# Patient Record
Sex: Female | Born: 1975 | Race: Black or African American | Hispanic: No | Marital: Single | Smoking: Never smoker
Health system: Southern US, Community
[De-identification: ages and names within clinical notes are randomized; demographics above are authoritative.]

## PROBLEM LIST (undated history)

## (undated) DIAGNOSIS — I10 Essential (primary) hypertension: Secondary | ICD-10-CM

## (undated) DIAGNOSIS — R51 Headache: Secondary | ICD-10-CM

## (undated) DIAGNOSIS — R519 Headache, unspecified: Secondary | ICD-10-CM

---

## 1998-07-27 ENCOUNTER — Ambulatory Visit (HOSPITAL_COMMUNITY): Admission: RE | Admit: 1998-07-27 | Discharge: 1998-07-27 | Payer: Self-pay | Admitting: Obstetrics and Gynecology

## 1998-08-19 HISTORY — PX: TUBAL LIGATION: SHX77

## 1998-09-11 ENCOUNTER — Ambulatory Visit (HOSPITAL_COMMUNITY): Admission: RE | Admit: 1998-09-11 | Discharge: 1998-09-11 | Payer: Self-pay | Admitting: Obstetrics

## 1998-12-24 ENCOUNTER — Inpatient Hospital Stay (HOSPITAL_COMMUNITY): Admission: AD | Admit: 1998-12-24 | Discharge: 1998-12-24 | Payer: Self-pay | Admitting: Family Medicine

## 1998-12-25 ENCOUNTER — Inpatient Hospital Stay (HOSPITAL_COMMUNITY): Admission: RE | Admit: 1998-12-25 | Discharge: 1998-12-25 | Payer: Self-pay | Admitting: *Deleted

## 1999-01-03 ENCOUNTER — Inpatient Hospital Stay (HOSPITAL_COMMUNITY): Admission: AD | Admit: 1999-01-03 | Discharge: 1999-01-03 | Payer: Self-pay | Admitting: Obstetrics

## 1999-02-12 ENCOUNTER — Inpatient Hospital Stay (HOSPITAL_COMMUNITY): Admission: AD | Admit: 1999-02-12 | Discharge: 1999-02-12 | Payer: Self-pay | Admitting: Obstetrics

## 1999-02-12 ENCOUNTER — Encounter: Payer: Self-pay | Admitting: Obstetrics

## 1999-02-15 ENCOUNTER — Inpatient Hospital Stay (HOSPITAL_COMMUNITY): Admission: AD | Admit: 1999-02-15 | Discharge: 1999-02-18 | Payer: Self-pay | Admitting: Obstetrics & Gynecology

## 1999-02-15 ENCOUNTER — Encounter: Payer: Self-pay | Admitting: Obstetrics & Gynecology

## 1999-02-17 ENCOUNTER — Encounter (INDEPENDENT_AMBULATORY_CARE_PROVIDER_SITE_OTHER): Payer: Self-pay | Admitting: *Deleted

## 2005-04-26 ENCOUNTER — Other Ambulatory Visit: Admission: RE | Admit: 2005-04-26 | Discharge: 2005-04-26 | Payer: Self-pay | Admitting: Obstetrics and Gynecology

## 2006-05-13 ENCOUNTER — Other Ambulatory Visit: Admission: RE | Admit: 2006-05-13 | Discharge: 2006-05-13 | Payer: Self-pay | Admitting: Obstetrics and Gynecology

## 2009-11-20 ENCOUNTER — Emergency Department (HOSPITAL_COMMUNITY): Admission: EM | Admit: 2009-11-20 | Discharge: 2009-11-20 | Payer: Self-pay | Admitting: Emergency Medicine

## 2010-11-07 LAB — URINALYSIS, ROUTINE W REFLEX MICROSCOPIC
Bilirubin Urine: NEGATIVE
Glucose, UA: NEGATIVE mg/dL
Hgb urine dipstick: NEGATIVE
Ketones, ur: 15 mg/dL — AB
Nitrite: NEGATIVE
Protein, ur: NEGATIVE mg/dL
Specific Gravity, Urine: 1.031 — ABNORMAL HIGH (ref 1.005–1.030)
Urobilinogen, UA: 0.2 mg/dL (ref 0.0–1.0)
pH: 7 (ref 5.0–8.0)

## 2010-11-07 LAB — PREGNANCY, URINE: Preg Test, Ur: NEGATIVE

## 2010-11-07 LAB — URINE MICROSCOPIC-ADD ON

## 2010-11-07 LAB — GC/CHLAMYDIA PROBE AMP, GENITAL
Chlamydia, DNA Probe: NEGATIVE
GC Probe Amp, Genital: NEGATIVE

## 2010-11-07 LAB — WET PREP, GENITAL: Yeast Wet Prep HPF POC: NONE SEEN

## 2011-04-26 ENCOUNTER — Inpatient Hospital Stay (INDEPENDENT_AMBULATORY_CARE_PROVIDER_SITE_OTHER)
Admission: RE | Admit: 2011-04-26 | Discharge: 2011-04-26 | Disposition: A | Discharge status: Home / Self Care | Payer: 59 | Source: Ambulatory Visit | Attending: Family Medicine | Admitting: Family Medicine

## 2011-04-26 DIAGNOSIS — J069 Acute upper respiratory infection, unspecified: Secondary | ICD-10-CM

## 2013-01-04 ENCOUNTER — Ambulatory Visit: Payer: Self-pay | Admitting: Family Medicine

## 2013-01-04 DIAGNOSIS — Z0289 Encounter for other administrative examinations: Secondary | ICD-10-CM

## 2013-02-27 ENCOUNTER — Emergency Department (HOSPITAL_COMMUNITY): Payer: Self-pay

## 2013-02-27 ENCOUNTER — Emergency Department (HOSPITAL_COMMUNITY)
Admission: EM | Admit: 2013-02-27 | Discharge: 2013-02-27 | Disposition: A | Discharge status: Home / Self Care | Payer: Self-pay | Attending: Emergency Medicine | Admitting: Emergency Medicine

## 2013-02-27 ENCOUNTER — Encounter (HOSPITAL_COMMUNITY): Payer: Self-pay | Admitting: *Deleted

## 2013-02-27 DIAGNOSIS — M25519 Pain in unspecified shoulder: Secondary | ICD-10-CM | POA: Insufficient documentation

## 2013-02-27 DIAGNOSIS — M19019 Primary osteoarthritis, unspecified shoulder: Secondary | ICD-10-CM | POA: Insufficient documentation

## 2013-02-27 DIAGNOSIS — Z791 Long term (current) use of non-steroidal anti-inflammatories (NSAID): Secondary | ICD-10-CM | POA: Insufficient documentation

## 2013-02-27 DIAGNOSIS — R209 Unspecified disturbances of skin sensation: Secondary | ICD-10-CM | POA: Insufficient documentation

## 2013-02-27 DIAGNOSIS — M25512 Pain in left shoulder: Secondary | ICD-10-CM

## 2013-02-27 DIAGNOSIS — M2550 Pain in unspecified joint: Secondary | ICD-10-CM | POA: Insufficient documentation

## 2013-02-27 DIAGNOSIS — M1990 Unspecified osteoarthritis, unspecified site: Secondary | ICD-10-CM

## 2013-02-27 MED ORDER — NAPROXEN 500 MG PO TABS: 500.0000 mg | ORAL_TABLET | Freq: Two times a day (BID) | ORAL | Status: DC

## 2013-02-27 MED ORDER — IBUPROFEN 800 MG PO TABS
800.0000 mg | ORAL_TABLET | Freq: Once | ORAL | Status: AC
  Administered 2013-02-27: 800 mg via ORAL
  Filled 2013-02-27: qty 1

## 2013-02-27 MED ORDER — CYCLOBENZAPRINE HCL 10 MG PO TABS: 10.0000 mg | ORAL_TABLET | Freq: Two times a day (BID) | ORAL | Status: DC | PRN

## 2013-02-27 NOTE — ED Provider Notes (Signed)
History    CSN: [id number] Arrival date & time 02/27/13  1046  First MD Initiated Contact with Patient 02/27/13 1104     Chief Complaint  Patient presents with  . Shoulder Pain   (Consider location/radiation/quality/duration/timing/severity/associated sxs/prior Treatment) The history is provided by the patient. No language interpreter was used.  Sandra Robbins is a 37 y/o F presenting to the ED, with daughter, with left shoulder pain that has been ongoing for the past 2 weeks, stated that the pain has gotten progressively worse. Patient reported that the pain is localized to the left shoulder described as a tenderness and aching that is constant with intermittent episodes of numbness and tingling that radiates down the left arm to the tips of the fingers. Patient reported that the pain is worse when she lays on her left side and with lifting. Stated that nothing makes the pain better. Patient reported that she has been using Tylenol extra strength to dull down the discomfort. 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 Denied neck pain, injury, fall, blurred vision, visual distortions.  [facility]  History reviewed. No pertinent past medical history. History reviewed. No pertinent past surgical history. History reviewed. No pertinent family history. History  Substance Use Topics  . Smoking status: Never Smoker   . Smokeless tobacco: Not on file  . Alcohol Use: No   OB History   Grav Para Term Preterm Abortions TAB SAB Ect Mult Living                 Review of Systems  Constitutional: Negative for fever and chills.  HENT: Negative for neck pain and neck stiffness.   Eyes: Negative for visual disturbance.  Respiratory: Negative for chest tightness and shortness of breath.   Cardiovascular: Negative for chest pain.  Musculoskeletal: Positive for arthralgias (left shoulder pain).  Neurological: Positive for  numbness. Negative for dizziness, light-headedness and headaches.  All other systems reviewed and are negative.    Allergies  Review of patient's allergies indicates no known allergies.  Home Medications   Current Outpatient Rx  Name  Route  Sig  Dispense  Refill  . acetaminophen (TYLENOL) 500 MG tablet   Oral   Take 500 mg by mouth every 6 (six) hours as needed for pain.         . cyclobenzaprine (FLEXERIL) 10 MG tablet   Oral   Take 1 tablet (10 mg total) by mouth 2 (two) times daily as needed for muscle spasms.   20 tablet   0   . naproxen (NAPROSYN) 500 MG tablet   Oral   Take 1 tablet (500 mg total) by mouth 2 (two) times daily.   30 tablet   0    BP 137/74  Pulse 70  Temp(Src) 98.6 F (37 C) (Oral)  Resp 20  Wt 200 lb (90.719 kg)  SpO2 99%  LMP 02/12/2013 Physical Exam  Nursing note and vitals reviewed. Constitutional: She is oriented to person, place, and time. She appears well-developed and well-nourished. No distress.  HENT:  Head: Normocephalic and atraumatic.  Eyes: Conjunctivae and EOM are normal. Pupils are equal, round, and reactive to light. Right eye exhibits no discharge. Left eye exhibits no discharge.  Neck: Normal range of motion. Neck supple.  Cardiovascular: Normal rate, regular rhythm and normal heart sounds.  Exam reveals no friction rub.   No murmur heard. Pulses:  Radial pulses are 2+ on the right side, and 2+ on the left side.  Pulmonary/Chest: Effort normal and breath sounds normal. No respiratory distress. She has no wheezes. She has no rales.  Musculoskeletal: Normal range of motion. She exhibits tenderness.       Left shoulder: She exhibits tenderness. She exhibits no swelling, no effusion, no crepitus, no deformity, no laceration, no pain, no spasm and normal pulse.       Arms: Negative swelling, erythema, deformity, warmth to touch of the left shoulder. Negative crepitus with motion.   Full ROM to left shoulder with mild  discomfort noted to adduction.  Negative drop arm test Negative apprehension test Strength 5+/5+ with resistance  Lymphadenopathy:    She has no cervical adenopathy.  Neurological: She is alert and oriented to person, place, and time. No cranial nerve deficit. She exhibits normal muscle tone. Coordination normal.  Cranial nerves III-XII grossly intact  Sensation intact to upper extremities, bilaterally, with differentiation to sharp and dull touch  Skin: Skin is warm and dry. No rash noted. She is not diaphoretic. No erythema.  Psychiatric: She has a normal mood and affect. Her behavior is normal. Thought content normal.    ED Course  Procedures (including critical care time) Labs Reviewed - No data to display Dg Cervical Spine Complete  02/27/2013   *RADIOLOGY REPORT*  Clinical Data: Left-sided neck pain and shoulder pain.  CERVICAL SPINE - COMPLETE 4+ VIEW  Comparison: None.  Findings: Cervical spine is visualized from skull base through the cervicothoracic junction.  The prevertebral soft tissues are within normal limits.  Alignment is anatomic.  The foramina are widely patent bilaterally.  The lung apices are clear.  IMPRESSION: Negative cervical spine radiographs.   Original Report Authenticated By: Marin Roberts, M.D.   Dg Shoulder Left  02/27/2013   *RADIOLOGY REPORT*  Clinical Data: Left-sided shoulder and neck pain.  LEFT SHOULDER - 2+ VIEW  Comparison:  None.  Findings:  There is no evidence of fracture or dislocation.  There is no evidence of arthropathy or other focal bone abnormality. Soft tissues are unremarkable.  IMPRESSION: Negative.   Original Report Authenticated By: Myles Rosenthal, M.D.   1. Shoulder pain, left   2. Arthritis     MDM  Patient presenting to the ED with left shoulder pain that has been ongoing for the past 2 weeks with worsening discomfort. 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  Denied injury, fall. Negative deformity noted to the left shoulder. Negative apprehension and drop arm test. Pulses palpable. Negative neurological deficits. Strength intact. Full ROM with mild discomfort with full adduction to the left shoulder. Mild discomfort upon palpation. Negative findings to imaging. Suspicion to be rotator cuff injury with association of arthritis. Patient stable, afebrile. Discharged patient - place in sling immobilizer to be worn only when active and for 3 days for comfort. Referred [facility] to [facility]ics [facility]. Discharged patient with anti-inflammatories and muscle relaxer. Discussed with patient to rest, elevate, prop up with pillows, alternate between ice and heat - recommended icy-hot. Discussed with patient to avoid strenuous activity - work note given. Discussed with patient to continue to monitor symptoms and if symptoms are to worsen or change to report back to the ED - strict return instructions given. Patient agreed to plan of care, understood, all questions answered.   Raymon Mutton, PA-C 02/27/13 2033

## 2013-02-27 NOTE — ED Notes (Addendum)
Pt reports she does a lot of lifting pulling at work. Reports left shoulder pain x7 days, now pain is radiating down arm. Arm has been tingling/numb since wednesday. Pain 6/10. Denies sob

## 2013-02-27 NOTE — ED Notes (Signed)
Patient transported to X-ray 

## 2013-02-28 NOTE — ED Provider Notes (Signed)
Medical screening examination/treatment/procedure(s) were performed by non-physician practitioner and as supervising physician I was immediately available for consultation/collaboration.   Suzi Roots, MD 02/28/13 [phone]

## 2013-06-18 ENCOUNTER — Emergency Department (HOSPITAL_COMMUNITY)
Admission: EM | Admit: 2013-06-18 | Discharge: 2013-06-18 | Disposition: A | Discharge status: Home / Self Care | Payer: BC Managed Care – PPO | Attending: Emergency Medicine | Admitting: Emergency Medicine

## 2013-06-18 ENCOUNTER — Encounter (HOSPITAL_COMMUNITY): Payer: Self-pay | Admitting: Emergency Medicine

## 2013-06-18 DIAGNOSIS — S39012A Strain of muscle, fascia and tendon of lower back, initial encounter: Secondary | ICD-10-CM

## 2013-06-18 DIAGNOSIS — X503XXA Overexertion from repetitive movements, initial encounter: Secondary | ICD-10-CM | POA: Insufficient documentation

## 2013-06-18 DIAGNOSIS — Y939 Activity, unspecified: Secondary | ICD-10-CM | POA: Insufficient documentation

## 2013-06-18 DIAGNOSIS — Y990 Civilian activity done for income or pay: Secondary | ICD-10-CM | POA: Insufficient documentation

## 2013-06-18 DIAGNOSIS — Y9289 Other specified places as the place of occurrence of the external cause: Secondary | ICD-10-CM | POA: Insufficient documentation

## 2013-06-18 DIAGNOSIS — S336XXA Sprain of sacroiliac joint, initial encounter: Secondary | ICD-10-CM | POA: Insufficient documentation

## 2013-06-18 MED ORDER — NAPROXEN 500 MG PO TABS: 500.0000 mg | ORAL_TABLET | Freq: Two times a day (BID) | ORAL | Status: DC

## 2013-06-18 MED ORDER — CYCLOBENZAPRINE HCL 10 MG PO TABS: 10.0000 mg | ORAL_TABLET | Freq: Two times a day (BID) | ORAL | Status: DC | PRN

## 2013-06-18 NOTE — ED Notes (Signed)
Pt complains of L lower back pain since this am. Pt reports hx of straining at work. Pt ambulatory

## 2013-06-18 NOTE — ED Provider Notes (Signed)
CSN: [id number]     Arrival date & time 06/18/13  1151 History   First MD Initiated Contact with Patient 06/18/13 1154     No chief complaint on file.  (Consider location/radiation/quality/duration/timing/severity/associated sxs/prior Treatment) HPI  37 year old female with no significant past medical history presents for evaluations of low back pain. Patient reports this morning when she woke up she experiencing a sharp stabbing pain to her left low back. Pain is severe, 10 out of 10, nonradiating, worsening when she leans forward and with movement and improves when she lies still. Pain felt like a bad muscle cramps. She took some ibuprofen initially with minimal improvement. No associated fever, chills, abdominal pain, urinary or bowel incontinence, or saddle paresthesia. Denies any dysuria or hematuria. Denies any recent trauma however states at work she does have to do a lot of heavy pulling and lifting. Last normal bowel movement was this morning. Denies any rash.  No history of IV drug use or history of cancer.  No past medical history on file. No past surgical history on file. No family history on file. History  Substance Use Topics  . Smoking status: Never Smoker   . Smokeless tobacco: Not on file  . Alcohol Use: No   OB History   Grav Para Term Preterm Abortions TAB SAB Ect Mult Living                 Review of Systems  Constitutional: Negative for fever.  Genitourinary: Negative for dysuria, hematuria and pelvic pain.  Musculoskeletal: Positive for back pain.  Skin: Negative for rash.  Neurological: Negative for numbness.    Allergies  Review of patient's allergies indicates no known allergies.  Home Medications   Current Outpatient Rx  Name  Route  Sig  Dispense  Refill  . acetaminophen (TYLENOL) 500 MG tablet   Oral   Take 500 mg by mouth every 6 (six) hours as needed for pain.         . cyclobenzaprine (FLEXERIL) 10 MG tablet   Oral   Take 1 tablet (10 mg  total) by mouth 2 (two) times daily as needed for muscle spasms.   20 tablet   0   . naproxen (NAPROSYN) 500 MG tablet   Oral   Take 1 tablet (500 mg total) by mouth 2 (two) times daily.   30 tablet   0    There were no vitals taken for this visit. Physical Exam  Nursing note and vitals reviewed. Constitutional: She appears well-developed and well-nourished. No distress.  HENT:  Head: Atraumatic.  Eyes: Conjunctivae are normal.  Neck: Neck supple.  Abdominal: There is no tenderness.  Genitourinary:  No CVA tenderness  Musculoskeletal: She exhibits tenderness (Left paralumbar tenderness on palpation worsening with back flexion and rotation. No overlying skin changes. Negative straight leg raise.).  Neurological: She is alert.  Intact deep tendon reflex bilaterally, no foot drop. Intact distal pedal pulses  Skin: No rash noted.  Psychiatric: She has a normal mood and affect.    ED Course  Procedures (including critical care time)  Patient presents with low back pain, reproducible on exam. No red flags. Patient was giving rice therapy instruction. We'll discharge with NSAIDs and muscle relaxant. Return precautions discussed. Patient voiced understanding and agrees with plan. Patient able to ambulate without difficulty.   Labs Review Labs Reviewed - No data to display Imaging Review No results found.  EKG Interpretation   None       MDM  1. Low back strain, initial encounter    BP 139/84  Pulse 76  Temp(Src) 98.2 F (36.8 C) (Oral)  Resp 16  SpO2 93%     Fayrene Helper, PA-C 06/18/13 1209

## 2013-06-18 NOTE — ED Provider Notes (Signed)
Medical screening examination/treatment/procedure(s) were performed by non-physician practitioner and as supervising physician I was immediately available for consultation/collaboration.  EKG Interpretation   None        Doug Sou, MD 06/18/13 1649

## 2015-01-03 ENCOUNTER — Emergency Department (INDEPENDENT_AMBULATORY_CARE_PROVIDER_SITE_OTHER)
Admission: EM | Admit: 2015-01-03 | Discharge: 2015-01-03 | Disposition: A | Discharge status: Home / Self Care | Payer: 59 | Source: Home / Self Care | Attending: Family Medicine | Admitting: Family Medicine

## 2015-01-03 ENCOUNTER — Emergency Department (HOSPITAL_COMMUNITY): Payer: 59

## 2015-01-03 ENCOUNTER — Encounter (HOSPITAL_COMMUNITY): Payer: Self-pay | Admitting: Emergency Medicine

## 2015-01-03 ENCOUNTER — Emergency Department (HOSPITAL_COMMUNITY)
Admission: EM | Admit: 2015-01-03 | Discharge: 2015-01-03 | Disposition: A | Discharge status: Home / Self Care | Payer: 59 | Attending: Emergency Medicine | Admitting: Emergency Medicine

## 2015-01-03 ENCOUNTER — Encounter (HOSPITAL_COMMUNITY): Payer: Self-pay

## 2015-01-03 DIAGNOSIS — R0789 Other chest pain: Secondary | ICD-10-CM | POA: Insufficient documentation

## 2015-01-03 DIAGNOSIS — R5383 Other fatigue: Secondary | ICD-10-CM | POA: Diagnosis not present

## 2015-01-03 DIAGNOSIS — Z791 Long term (current) use of non-steroidal anti-inflammatories (NSAID): Secondary | ICD-10-CM | POA: Insufficient documentation

## 2015-01-03 DIAGNOSIS — R0600 Dyspnea, unspecified: Secondary | ICD-10-CM | POA: Diagnosis not present

## 2015-01-03 DIAGNOSIS — R079 Chest pain, unspecified: Secondary | ICD-10-CM | POA: Diagnosis present

## 2015-01-03 DIAGNOSIS — R0602 Shortness of breath: Secondary | ICD-10-CM | POA: Diagnosis not present

## 2015-01-03 DIAGNOSIS — R002 Palpitations: Secondary | ICD-10-CM

## 2015-01-03 DIAGNOSIS — Z3202 Encounter for pregnancy test, result negative: Secondary | ICD-10-CM | POA: Diagnosis not present

## 2015-01-03 DIAGNOSIS — R072 Precordial pain: Secondary | ICD-10-CM | POA: Insufficient documentation

## 2015-01-03 LAB — CBC
HEMATOCRIT: 39.3 % (ref 36.0–46.0)
HEMOGLOBIN: 12.8 g/dL (ref 12.0–15.0)
MCH: 29.4 pg (ref 26.0–34.0)
MCHC: 32.6 g/dL (ref 30.0–36.0)
MCV: 90.1 fL (ref 78.0–100.0)
Platelets: 256 10*3/uL (ref 150–400)
RBC: 4.36 MIL/uL (ref 3.87–5.11)
RDW: 12.8 % (ref 11.5–15.5)
WBC: 4.9 10*3/uL (ref 4.0–10.5)

## 2015-01-03 LAB — BASIC METABOLIC PANEL
Anion gap: 7 (ref 5–15)
BUN: 10 mg/dL (ref 6–20)
CHLORIDE: 107 mmol/L (ref 101–111)
CO2: 24 mmol/L (ref 22–32)
Calcium: 9 mg/dL (ref 8.9–10.3)
Creatinine, Ser: 0.75 mg/dL (ref 0.44–1.00)
GFR calc Af Amer: >60 mL/min (ref >60)
GLUCOSE: 87 mg/dL (ref 65–99)
POTASSIUM: 4.2 mmol/L (ref 3.5–5.1)
SODIUM: 138 mmol/L (ref 135–145)

## 2015-01-03 LAB — I-STAT TROPONIN, ED: TROPONIN I, POC: 0 ng/mL (ref 0.00–0.08)

## 2015-01-03 LAB — POC URINE PREG, ED: PREG TEST UR: NEGATIVE

## 2015-01-03 MED ORDER — ASPIRIN 325 MG PO TABS
325.0000 mg | ORAL_TABLET | ORAL | Status: AC
  Administered 2015-01-03: 325 mg via ORAL
  Filled 2015-01-03: qty 1

## 2015-01-03 NOTE — ED Provider Notes (Signed)
CSN: [id number]     Arrival date & time 01/03/15  1600 History   First MD Initiated Contact with Patient 01/03/15 1619     Chief Complaint  Patient presents with  . Chest Pain     (Consider location/radiation/quality/duration/timing/severity/associated sxs/prior Treatment) HPI Comments: 39 year old femalewith past medical history comes with chief complaint of occasional chest pain shortness of breath. Patient states it happens multiple times per day. Has no inciting factors but she endorses it may be worse with lifting at work. Describing a left-sided sharp pain. Does have occasional associated shortness of breath.  Patient is a 39 y.o. female presenting with chest pain.  Chest Pain Pain location:  L chest Pain quality: sharp   Pain radiates to:  Does not radiate Pain radiates to the back: no   Pain severity:  Moderate Onset quality:  Unable to specify Timing:  Intermittent Progression:  Unchanged Chronicity:  New Context comment:  None known Associated symptoms: shortness of breath   Associated symptoms: no abdominal pain, no back pain, no fever and no headache     History reviewed. No pertinent past medical history. History reviewed. No pertinent past surgical history. History reviewed. No pertinent family history. History  Substance Use Topics  . Smoking status: Never Smoker   . Smokeless tobacco: Not on file  . Alcohol Use: No   OB History    No data available     Review of Systems  Constitutional: Negative for fever.  HENT: Negative for congestion.   Respiratory: Positive for shortness of breath.   Cardiovascular: Positive for chest pain.  Gastrointestinal: Negative for abdominal pain.  Genitourinary: Negative for dysuria.  Musculoskeletal: Negative for back pain.  Skin: Negative for rash.  Neurological: Negative for headaches.  Psychiatric/Behavioral: Negative for confusion.  All other systems reviewed and are negative.     Allergies  Review of  patient's allergies indicates no known allergies.  Home Medications   Prior to Admission medications   Medication Sig Start Date End Date Taking? Authorizing Provider  acetaminophen (TYLENOL) 500 MG tablet Take 500 mg by mouth every 6 (six) hours as needed for pain.   Yes Historical Provider, MD  Aspirin-Salicylamide-Caffeine (BC HEADACHE POWDER PO) Take 1 packet by mouth daily as needed. For headache   Yes Historical Provider, MD  cyclobenzaprine (FLEXERIL) 10 MG tablet Take 1 tablet (10 mg total) by mouth 2 (two) times daily as needed for muscle spasms. Patient not taking: Reported on 01/03/2015 06/18/13   Fayrene HelperBowie Tran, PA-C  naproxen (NAPROSYN) 500 MG tablet Take 1 tablet (500 mg total) by mouth 2 (two) times daily. Patient not taking: Reported on 01/03/2015 06/18/13   Fayrene HelperBowie Tran, PA-C   BP 135/71 mmHg  Pulse 67  Resp 12  Ht 5\' 7"  (1.702 m)  Wt 209 lb 1.6 oz (94.847 kg)  BMI 32.74 kg/m2  SpO2 100%  LMP 12/12/2014 Physical Exam  Constitutional: She is oriented to person, place, and time. She appears well-developed and well-nourished.  HENT:  Head: Normocephalic.  Eyes: Pupils are equal, round, and reactive to light.  Neck: Normal range of motion.  Cardiovascular: Normal rate and intact distal pulses.   No murmur heard. Reproducible chest wall pain  Pulmonary/Chest: Effort normal. No respiratory distress. She exhibits tenderness.  Abdominal: Soft. She exhibits no distension. There is no tenderness.  Musculoskeletal: Normal range of motion. She exhibits no tenderness.  Neurological: She is alert and oriented to person, place, and time. No cranial nerve deficit. She exhibits normal muscle  tone.  Skin: Skin is warm.  Psychiatric: She has a normal mood and affect.  Vitals reviewed.   ED Course  Procedures (including critical care time) Labs Review Labs Reviewed  CBC  BASIC METABOLIC PANEL  I-STAT TROPOININ, ED  POC URINE PREG, ED    Imaging Review Dg Chest 2  View  01/03/2015   CLINICAL DATA:  Acute chest pain into the left arm for 1 week.  EXAM: CHEST  2 VIEW  COMPARISON:  None.  FINDINGS: The heart size and mediastinal contours are within normal limits. Both lungs are clear. The visualized skeletal structures are unremarkable.  IMPRESSION: No active cardiopulmonary disease.   Electronically Signed   By: Judie PetitM.  Shick M.D.   On: 01/03/2015 17:04     EKG Interpretation   Date/Time:  Tuesday Jan 03 2015 16:05:51 EDT Ventricular Rate:  87 PR Interval:  180 QRS Duration: 72 QT Interval:  350 QTC Calculation: 421 R Axis:   -12 Text Interpretation:  Normal sinus rhythm st abnormalities in I, aVL  Abnormal ECG No significant change since last tracing Confirmed by Karma GanjaLINKER   MD, MARTHA [phone]([id number]) on 01/03/2015 4:35:45 PM      MDM  39 year old female with minimal cardiac risk factors comes in with atypical chest pain. This chest pain is reproducible on exam. It is present over her pectoral area. States the pain is worse with lifting and exertion. Patient also endorses this pain often comes with stress and anxiety. Patient with low HEAR score. Initial EKG at urgent care showed possible STEMI as computerized read. However on examination of the EKG there is no ST elevation. Patient with normal sinus rate and rhythm with only subtle nonspecific ST changes in 1 aVL. Initial labs unremarkable. Chest x-ray showed no consolidative process on mediastinum etc. initial troponin negative. Do not suspect this is a cardiovascular issue at this time. We'll recommend follow-up with PCP. Discussed return precautions and patient discharged  Final diagnoses:  None        Bridgett Larssonhris Beatrice Ziehm, MD 01/03/15 2229  Jerelyn ScottMartha Linker, MD 01/03/15 [phone]

## 2015-01-03 NOTE — ED Notes (Signed)
C/o chest pain with sob.   Nausea.  Fatigue.   Symptoms gradually getting worse.  Denies sweats and vomiting.  No cardiac hx.

## 2015-01-03 NOTE — ED Notes (Signed)
Pt reports left sided chest pain x 1 week that is worsening, intermittant- lasts 3-5 minutes, twice per hour.   Reports fatigue, shortness of breath.  No shortness of breath at this time.

## 2015-01-03 NOTE — ED Provider Notes (Signed)
CSN: [id number]     Arrival date & time 01/03/15  1414 History   First MD Initiated Contact with Patient 01/03/15 1520     Chief Complaint  Patient presents with  . Chest Pain   (Consider location/radiation/quality/duration/timing/severity/associated sxs/prior Treatment) HPI Comments: 39 year old female complaining of intermittent chest pain to the mid and left anterior chest for one week. It is described as sharp sometimes but most of the time it is a pressure feeling or heavy feeling on her chest. It is associated with shortness of breath and persistent fatigue and occasional dizziness. She denies associated diaphoresis, nausea or vomiting. She does complain of having intermittent rapid pulses, throbbing heart rate and palpitations. She has no history of cardiopulmonary disease. She does not smoke or consume alcohol.   History reviewed. No pertinent past medical history. History reviewed. No pertinent past surgical history. History reviewed. No pertinent family history. History  Substance Use Topics  . Smoking status: Never Smoker   . Smokeless tobacco: Not on file  . Alcohol Use: No   OB History    No data available     Review of Systems  Constitutional: Positive for fatigue. Negative for fever, diaphoresis and activity change.  HENT: Negative.   Eyes: Negative for visual disturbance.  Respiratory: Positive for shortness of breath. Negative for cough, wheezing and stridor.   Cardiovascular: Positive for chest pain and palpitations. Negative for leg swelling.  Gastrointestinal: Negative for nausea, vomiting and abdominal pain.  Genitourinary: Negative.   Musculoskeletal: Negative for back pain and neck pain.  Skin: Negative.   Neurological: Positive for dizziness. Negative for seizures and speech difficulty.  Psychiatric/Behavioral: The patient is nervous/anxious.     Allergies  Review of patient's allergies indicates no known allergies.  Home Medications   Prior to  Admission medications   Medication Sig Start Date End Date Taking? Authorizing Provider  acetaminophen (TYLENOL) 500 MG tablet Take 500 mg by mouth every 6 (six) hours as needed for pain.    Historical Provider, MD  cyclobenzaprine (FLEXERIL) 10 MG tablet Take 1 tablet (10 mg total) by mouth 2 (two) times daily as needed for muscle spasms. 06/18/13   Fayrene HelperBowie Tran, PA-C  naproxen (NAPROSYN) 500 MG tablet Take 1 tablet (500 mg total) by mouth 2 (two) times daily. 06/18/13   Fayrene HelperBowie Tran, PA-C   BP 135/73 mmHg  Pulse 80  Temp(Src) 98.2 F (36.8 C) (Oral)  Resp 16  SpO2 98%  LMP 12/12/2014 Physical Exam  Constitutional: She is oriented to person, place, and time. She appears well-developed and well-nourished. No distress.  Eyes: EOM are normal. Pupils are equal, round, and reactive to light.  Neck: Normal range of motion. Neck supple.  Cardiovascular: Normal rate, regular rhythm, normal heart sounds and intact distal pulses.   No murmur heard. Pulmonary/Chest: Effort normal and breath sounds normal. No respiratory distress. She has no wheezes. She has no rales. She exhibits tenderness.  Palpation of the left sternal border and left upper anterior chest produces moderate sharp pain.  Abdominal: Soft. There is no tenderness.  Musculoskeletal: Normal range of motion. She exhibits no edema.  Lymphadenopathy:    She has no cervical adenopathy.  Neurological: She is alert and oriented to person, place, and time. She exhibits normal muscle tone.  Skin: Skin is warm and dry.  Psychiatric: Judgment and thought content normal. Her mood appears anxious. She is not aggressive and not combative. Thought content is not delusional. Cognition and memory are normal.  Psychomotor agitation  Nursing note and vitals reviewed.   ED Course  Procedures (including critical care time) Labs Review Labs Reviewed - No data to display  Imaging Review No results found. ED ECG REPORT   Date: 01/03/2015  Rate:  81  Rhythm: NSR  QRS Axis: normal  Intervals: PR 190 ms. Borderline Prolongation of Q-T seg.  ST/T Wave abnormalities: T wave inversion III.  Early repol of ST seg V2  Conduction Disutrbances:No ectopy  Narrative Interpretation: NSR without definite  ischemic changes.  Old EKG Reviewed: none  I have personally reviewed the EKG tracing and  Agree with the computerized printout as noted.   MDM   1. Chest pain of uncertain etiology   2. Dyspnea   3. Heart palpitations   4. Other fatigue    Patient is stable and in no distress. Chest pain is somewhat atypical although she does have chest wall tenderness. Since she has associated symptoms that are worrisome despite the fact that she has associated anxiety, psychomotor agitation which would suggest a probable etiology she is being sent to the ED for additional evaluation for chest pain and associated symptoms.. Via shuttle. Consulted with Dr. Artis FlockKindl.     Hayden Rasmussenavid Lilliahna Schubring, NP 01/03/15 [phone]

## 2015-01-03 NOTE — ED Notes (Signed)
Patient transported to X-ray 

## 2015-01-03 NOTE — ED Notes (Signed)
EKG handed to Dr. Wentz 

## 2015-01-03 NOTE — ED Notes (Signed)
Dr Post at bedside

## 2015-01-03 NOTE — Discharge Instructions (Signed)

## 2017-08-02 ENCOUNTER — Encounter (HOSPITAL_COMMUNITY): Payer: Self-pay

## 2017-08-02 ENCOUNTER — Emergency Department (HOSPITAL_COMMUNITY)
Admission: EM | Admit: 2017-08-02 | Discharge: 2017-08-02 | Disposition: A | Discharge status: Home / Self Care | Payer: 59 | Attending: Emergency Medicine | Admitting: Emergency Medicine

## 2017-08-02 ENCOUNTER — Other Ambulatory Visit: Payer: Self-pay

## 2017-08-02 DIAGNOSIS — R51 Headache: Secondary | ICD-10-CM | POA: Diagnosis present

## 2017-08-02 DIAGNOSIS — R110 Nausea: Secondary | ICD-10-CM | POA: Diagnosis not present

## 2017-08-02 DIAGNOSIS — R519 Headache, unspecified: Secondary | ICD-10-CM

## 2017-08-02 MED ORDER — ACETAMINOPHEN 500 MG PO TABS
1000.0000 mg | ORAL_TABLET | Freq: Once | ORAL | Status: AC
  Administered 2017-08-02: 1000 mg via ORAL
  Filled 2017-08-02: qty 2

## 2017-08-02 NOTE — ED Provider Notes (Signed)
[facility]   CSN: [id number] Arrival date & time: 08/02/17  1543     History   Chief Complaint Chief Complaint  Patient presents with  . headache/nausea    HPI Sandra Robbins is a 41 y.o. female.  HPI comPlanes of gradual onset bad bad frontal headache onset 2 days ago.  Waxes and wanes she gets similar headaches approximately once per month though not as bad as this headache.  Associated symptoms include nausea no vomiting no fever no trauma nothing makes symptoms better or worse.  She treated herself with ibuprofen 4 tablets earlier today with relief.  Presently headache is mild and she has no nausea.  She denies visual changes no other associated symptoms nothing makes symptoms better or worse History reviewed. No pertinent past medical history.  There are no active problems to display for this patient.   History reviewed. No pertinent surgical history.  OB History    No data available       Home Medications    Prior to Admission medications   Medication Sig Start Date End Date Taking? Authorizing Provider  acetaminophen (TYLENOL) 500 MG tablet Take 500 mg by mouth every 6 (six) hours as needed for pain.    [provider]  Aspirin-Salicylamide-Caffeine (BC HEADACHE POWDER PO) Take 1 packet by mouth daily as needed. For headache    [provider]  cyclobenzaprine (FLEXERIL) 10 MG tablet Take 1 tablet (10 mg total) by mouth 2 (two) times daily as needed for muscle spasms. Patient not taking: Reported on 01/03/2015 06/18/13   Fayrene Helperran, [place], PA-C  naproxen (NAPROSYN) 500 MG tablet Take 1 tablet (500 mg total) by mouth 2 (two) times daily. Patient not taking: Reported on 01/03/2015 06/18/13   Fayrene Helperran, [place], PA-C    Family History No family history on file.  Social History Social History   Tobacco Use  . Smoking status: Never Smoker  . Smokeless tobacco: Never Used  Substance Use Topics  .  Alcohol use: No  . Drug use: No     Allergies   Patient has no known allergies.   Review of Systems Review of Systems  Constitutional: Negative.   HENT: Negative.   Respiratory: Negative.   Cardiovascular: Negative.   Gastrointestinal: Positive for nausea.  Musculoskeletal: Negative.   Skin: Negative.   Neurological: Positive for headaches.  Psychiatric/Behavioral: Negative.   All other systems reviewed and are negative.    Physical Exam Updated Vital Signs BP (!) 163/100 (BP Location: Right Arm)   Pulse 74   Temp 98.4 F (36.9 C) (Oral)   Resp 18   SpO2 98%   Physical Exam  Constitutional: She is oriented to person, place, and time. She appears well-developed and well-nourished. No distress.  HENT:  Head: Normocephalic and atraumatic.  Eyes: Conjunctivae are normal. Pupils are equal, round, and reactive to light.  Neck: Neck supple. No tracheal deviation present. No thyromegaly present.  Cardiovascular: Normal rate and regular rhythm.  No murmur heard. Pulmonary/Chest: Effort normal and breath sounds normal.  Abdominal: Soft. Bowel sounds are normal. She exhibits no distension. There is no tenderness.  Obese  Musculoskeletal: Normal range of motion. She exhibits no edema or tenderness.  Neurological: She is alert and oriented to person, place, and time. Coordination normal.  Glasgow Coma Score 15 gait normal Romberg normal pronator drift normal finger to nose normal DTRs symmetric bilaterally at knee jerk and ankle jerk and biceps was downgoing bilaterally  Skin: Skin is warm and dry. No rash noted.  Psychiatric: She has a normal mood and affect.  Nursing note and vitals reviewed.    ED Treatments / Results  Labs (all labs ordered are listed, but only abnormal results are displayed) Labs Reviewed - No data to display  EKG  EKG Interpretation None       Radiology No results found.  Procedures Procedures (including critical care time)  Medications  Ordered in ED Medications  acetaminophen (TYLENOL) tablet 1,000 mg (not administered)     Initial Impression / Assessment and Plan / ED Course  I have reviewed the triage vital signs and the nursing notes.  Pertinent labs & imaging results that were available during my care of the patient were reviewed by me and considered in my medical decision making (see chart for details).     5:35 PM she reports pain is mild she feels ready to go home she is alert and in no distress after treatment with Tylenol. Plan blood pressure recheck 3 weeks.  No further workup needed.  Tylenol for pain. Final Clinical Impressions(s) / ED Diagnoses  Dx #1 bad headache #2 elevated blood pressure Final diagnoses:  None    ED Discharge Orders    None       Doug SouJacubowitz, [place], MD 08/02/17 [phone]

## 2017-08-02 NOTE — ED Triage Notes (Signed)
Patient complains of 2 days of bilateral temporal headache with nausea. States that the symptoms started after eating pork. Denies trauma. Alert and oriented, no neuro deficits

## 2017-08-02 NOTE — Discharge Instructions (Signed)
Take Tylenol as directed for pain.  Get your blood pressure rechecked within the next 3 weeks at an urgent care center or at your primary care physician's office.  Today's was mildly elevated at 140/75

## 2017-09-04 ENCOUNTER — Other Ambulatory Visit: Payer: Self-pay | Admitting: Occupational Medicine

## 2017-09-04 ENCOUNTER — Ambulatory Visit: Payer: Self-pay

## 2017-09-04 DIAGNOSIS — M79672 Pain in left foot: Secondary | ICD-10-CM

## 2018-05-22 ENCOUNTER — Emergency Department (HOSPITAL_COMMUNITY): Payer: Self-pay

## 2018-05-22 ENCOUNTER — Other Ambulatory Visit: Payer: Self-pay

## 2018-05-22 ENCOUNTER — Observation Stay (HOSPITAL_COMMUNITY)
Admission: EM | Admit: 2018-05-22 | Discharge: 2018-05-23 | Disposition: A | Discharge status: Home / Self Care | Payer: Self-pay | Attending: Internal Medicine | Admitting: Internal Medicine

## 2018-05-22 ENCOUNTER — Observation Stay (HOSPITAL_COMMUNITY): Payer: Self-pay

## 2018-05-22 ENCOUNTER — Encounter (HOSPITAL_COMMUNITY): Payer: Self-pay

## 2018-05-22 DIAGNOSIS — E785 Hyperlipidemia, unspecified: Secondary | ICD-10-CM

## 2018-05-22 DIAGNOSIS — I10 Essential (primary) hypertension: Secondary | ICD-10-CM | POA: Insufficient documentation

## 2018-05-22 DIAGNOSIS — R0789 Other chest pain: Secondary | ICD-10-CM | POA: Insufficient documentation

## 2018-05-22 DIAGNOSIS — R739 Hyperglycemia, unspecified: Secondary | ICD-10-CM | POA: Insufficient documentation

## 2018-05-22 DIAGNOSIS — Z800 Family history of malignant neoplasm of digestive organs: Secondary | ICD-10-CM | POA: Insufficient documentation

## 2018-05-22 DIAGNOSIS — G459 Transient cerebral ischemic attack, unspecified: Principal | ICD-10-CM | POA: Diagnosis present

## 2018-05-22 DIAGNOSIS — R200 Anesthesia of skin: Secondary | ICD-10-CM

## 2018-05-22 DIAGNOSIS — Z8249 Family history of ischemic heart disease and other diseases of the circulatory system: Secondary | ICD-10-CM | POA: Insufficient documentation

## 2018-05-22 DIAGNOSIS — Z7982 Long term (current) use of aspirin: Secondary | ICD-10-CM | POA: Insufficient documentation

## 2018-05-22 DIAGNOSIS — E876 Hypokalemia: Secondary | ICD-10-CM

## 2018-05-22 DIAGNOSIS — Z79899 Other long term (current) drug therapy: Secondary | ICD-10-CM | POA: Insufficient documentation

## 2018-05-22 DIAGNOSIS — E049 Nontoxic goiter, unspecified: Secondary | ICD-10-CM | POA: Insufficient documentation

## 2018-05-22 HISTORY — DX: Headache: R51

## 2018-05-22 HISTORY — DX: Headache, unspecified: R51.9

## 2018-05-22 HISTORY — DX: Essential (primary) hypertension: I10

## 2018-05-22 LAB — BASIC METABOLIC PANEL
ANION GAP: 10 (ref 5–15)
BUN: 5 mg/dL — ABNORMAL LOW (ref 6–20)
CALCIUM: 10.3 mg/dL (ref 8.9–10.3)
CO2: 24 mmol/L (ref 22–32)
Chloride: 104 mmol/L (ref 98–111)
Creatinine, Ser: 0.71 mg/dL (ref 0.44–1.00)
GFR calc Af Amer: >60 mL/min (ref >60)
Glucose, Bld: 115 mg/dL — ABNORMAL HIGH (ref 70–99)
POTASSIUM: 3.7 mmol/L (ref 3.5–5.1)
SODIUM: 138 mmol/L (ref 135–145)

## 2018-05-22 LAB — PHOSPHORUS: Phosphorus: 3.3 mg/dL (ref 2.5–4.6)

## 2018-05-22 LAB — I-STAT TROPONIN, ED
TROPONIN I, POC: 0 ng/mL (ref 0.00–0.08)
TROPONIN I, POC: 0 ng/mL (ref 0.00–0.08)

## 2018-05-22 LAB — CBC
HEMATOCRIT: 41.6 % (ref 36.0–46.0)
HEMOGLOBIN: 13.6 g/dL (ref 12.0–15.0)
MCH: 29.8 pg (ref 26.0–34.0)
MCHC: 32.7 g/dL (ref 30.0–36.0)
MCV: 91.2 fL (ref 78.0–100.0)
Platelets: 330 10*3/uL (ref 150–400)
RBC: 4.56 MIL/uL (ref 3.87–5.11)
RDW: 12.3 % (ref 11.5–15.5)
WBC: 7.4 10*3/uL (ref 4.0–10.5)

## 2018-05-22 LAB — I-STAT BETA HCG BLOOD, ED (MC, WL, AP ONLY)

## 2018-05-22 LAB — TROPONIN I

## 2018-05-22 LAB — CREATININE, SERUM
Creatinine, Ser: 0.73 mg/dL (ref 0.44–1.00)
GFR calc non Af Amer: >60 mL/min (ref >60)

## 2018-05-22 LAB — MAGNESIUM: Magnesium: 1.9 mg/dL (ref 1.7–2.4)

## 2018-05-22 MED ORDER — ACETAMINOPHEN 160 MG/5ML PO SOLN: 650.0000 mg | ORAL | Status: DC | PRN

## 2018-05-22 MED ORDER — ACETAMINOPHEN 650 MG RE SUPP: 650.0000 mg | RECTAL | Status: DC | PRN

## 2018-05-22 MED ORDER — ENOXAPARIN SODIUM 40 MG/0.4ML ~~LOC~~ SOLN: 40.0000 mg | SUBCUTANEOUS | Status: DC

## 2018-05-22 MED ORDER — ENSURE ENLIVE PO LIQD
237.0000 mL | Freq: Two times a day (BID) | ORAL | Status: DC
  Administered 2018-05-23: 237 mL via ORAL
  Filled 2018-05-22 (×2): qty 237

## 2018-05-22 MED ORDER — ACETAMINOPHEN 325 MG PO TABS
650.0000 mg | ORAL_TABLET | ORAL | Status: DC | PRN
  Administered 2018-05-22: 650 mg via ORAL
  Filled 2018-05-22: qty 2

## 2018-05-22 MED ORDER — ACETAMINOPHEN 325 MG PO TABS: 650.0000 mg | ORAL_TABLET | ORAL | Status: DC | PRN

## 2018-05-22 MED ORDER — IOPAMIDOL (ISOVUE-370) INJECTION 76%
50.0000 mL | Freq: Once | INTRAVENOUS | Status: AC
  Administered 2018-05-22: 50 mL via INTRAVENOUS

## 2018-05-22 MED ORDER — ONDANSETRON HCL 4 MG/2ML IJ SOLN: 4.0000 mg | Freq: Four times a day (QID) | INTRAMUSCULAR | Status: DC | PRN

## 2018-05-22 MED ORDER — IOPAMIDOL (ISOVUE-370) INJECTION 76%
INTRAVENOUS | Status: AC
  Filled 2018-05-22: qty 50

## 2018-05-22 MED ORDER — ONDANSETRON HCL 4 MG PO TABS: 4.0000 mg | ORAL_TABLET | Freq: Four times a day (QID) | ORAL | Status: DC | PRN

## 2018-05-22 MED ORDER — LORAZEPAM 2 MG/ML IJ SOLN
1.0000 mg | Freq: Once | INTRAMUSCULAR | Status: AC | PRN
  Administered 2018-05-22: 1 mg via INTRAVENOUS
  Filled 2018-05-22: qty 1

## 2018-05-22 MED ORDER — STROKE: EARLY STAGES OF RECOVERY BOOK
Freq: Once | Status: AC
  Administered 2018-05-23: 06:00:00
  Filled 2018-05-22: qty 1

## 2018-05-22 MED ORDER — ENOXAPARIN SODIUM 40 MG/0.4ML ~~LOC~~ SOLN
40.0000 mg | SUBCUTANEOUS | Status: DC
  Administered 2018-05-22: 40 mg via SUBCUTANEOUS
  Filled 2018-05-22: qty 0.4

## 2018-05-22 NOTE — H&P (Signed)
History and Physical    Sandra Robbins [id number] DOB: 01-Feb-1976 DOA: 05/22/2018  PCP: [facility]   Patient coming from: Home.  I have personally briefly reviewed patient's old medical records in [facility]  Chief Complaint: Left arm heaviness.  HPI: Sandra Robbins is a 42 y.o. female with medical history significant of hypertension who was admitted recently to a different facility due to hypertension and last night he was at [facility] last night for elevated blood pressure as well.  She was discharged and went home, but this morning she is coming to this emergency department with complaints of left arm heaviness and tingling since that started around 5 AM when she woke up, but disappeared after about 3 hours.  She denies blurred vision, slurred speech, dizziness, nausea or gait imbalance.  She complains of mild precordial chest pain associated with palpitations, but denies dyspnea, dizziness, diaphoresis, PND, orthopnea or pitting edema of the lower extremities.  She denies abdominal pain, diarrhea, constipation, melena or hematochezia.  Denies dysuria, frequency or hematuria.  Denies heat or cold intolerance.  Denies polyuria, polydipsia, polyphagia or blurred vision.  No skin rashes.  ED Course: Initial vital signs temperature 98.6 F, pulse 111, respirations 18, blood pressure 187/104 mmHg and O2 sat 99% on room air.  Urine pregnancy test was negative.  Her CBC was normal.  BMP shows glucose of 115 and decreased BUN of 5 mg/dL, all other values are within normal limits.  EKG was normal sinus rhythm.  Troponin x2 has been negative.  Magnesium was 1.9 and phosphorus 3.3 mg/dL.  Chest radiograph was normal.  Her MRI is pending, by her CTA of the head and neck was normal.  Review of Systems: As per HPI otherwise 10 point review of systems negative.   Past Medical History:  Diagnosis Date  . Hypertension     History reviewed.  No pertinent surgical history.   reports that she has never smoked. She has never used smokeless tobacco. She reports that she does not drink alcohol or use drugs.  No Known Allergies  Family History  Problem Relation Age of Onset  . Hypertension Mother   . Pancreatic cancer Father 45  . Hypertension Sister   . Heart disease Sister   . Hypertension Sister   . Heart disease Sister   . Hypertension Sister     Prior to Admission medications   Medication Sig Start Date End Date Taking? Authorizing Provider  acetaminophen (TYLENOL) 500 MG tablet Take 500 mg by mouth every 6 (six) hours as needed for pain.    [provider]  Aspirin-Salicylamide-Caffeine (BC HEADACHE POWDER PO) Take 1 packet by mouth daily as needed. For headache    [provider]  cyclobenzaprine (FLEXERIL) 10 MG tablet Take 1 tablet (10 mg total) by mouth 2 (two) times daily as needed for muscle spasms. Patient not taking: Reported on 01/03/2015 06/18/13   Fayrene Helper, PA-C  naproxen (NAPROSYN) 500 MG tablet Take 1 tablet (500 mg total) by mouth 2 (two) times daily. Patient not taking: Reported on 01/03/2015 06/18/13   Fayrene Helper, PA-C    Physical Exam: Vitals:   05/22/18 1415 05/22/18 1430 05/22/18 1445 05/22/18 1500  BP: (!) 163/79 (!) 164/81 (!) 170/74 (!) 155/86  Pulse: 97 100 95 100  Resp: 17 (!) 29 18 15   Temp:      TempSrc:      SpO2: 99% 98% 97% 100%  Constitutional: NAD, calm, comfortable Eyes: PERRL, lids and conjunctivae normal ENMT: Mucous membranes are moist. Posterior pharynx clear of any exudate or lesions. Neck: normal, supple, no masses, no thyromegaly Respiratory: clear to auscultation bilaterally, no wheezing, no crackles. Normal respiratory effort. No accessory muscle use.  Cardiovascular: Regular rate and rhythm, no murmurs / rubs / gallops. No extremity edema. 2+ pedal pulses. No carotid bruits.  Abdomen: no tenderness, no masses palpated. No hepatosplenomegaly. Bowel  sounds positive.  Musculoskeletal: no clubbing / cyanosis. No joint deformity upper and lower extremities. Good ROM, no contractures. Normal muscle tone.  Skin: no rashes, lesions, ulcers. No induration Neurologic: CN 2-12 grossly intact. Sensation intact, DTR normal. Strength 5/5 in all 4.  Psychiatric: Normal judgment and insight. Alert and oriented x 3. Normal mood.   Labs on Admission: I have personally reviewed following labs and imaging studies  CBC: Recent Labs  Lab 05/22/18 0908  WBC 7.4  HGB 13.6  HCT 41.6  MCV 91.2  PLT 330   Basic Metabolic Panel: Recent Labs  Lab 05/22/18 0908  NA 138  K 3.7  CL 104  CO2 24  GLUCOSE 115*  BUN 5*  CREATININE 0.71  CALCIUM 10.3   GFR: CrCl cannot be calculated (Unknown ideal weight.). Liver Function Tests: No results for input(s): AST, ALT, ALKPHOS, BILITOT, PROT, ALBUMIN in the last 168 hours. No results for input(s): LIPASE, AMYLASE in the last 168 hours. No results for input(s): AMMONIA in the last 168 hours. Coagulation Profile: No results for input(s): INR, PROTIME in the last 168 hours. Cardiac Enzymes: No results for input(s): CKTOTAL, CKMB, CKMBINDEX, TROPONINI in the last 168 hours. BNP (last 3 results) No results for input(s): PROBNP in the last 8760 hours. HbA1C: No results for input(s): HGBA1C in the last 72 hours. CBG: No results for input(s): GLUCAP in the last 168 hours. Lipid Profile: No results for input(s): CHOL, HDL, LDLCALC, TRIG, CHOLHDL, LDLDIRECT in the last 72 hours. Thyroid Function Tests: No results for input(s): TSH, T4TOTAL, FREET4, T3FREE, THYROIDAB in the last 72 hours. Anemia Panel: No results for input(s): VITAMINB12, FOLATE, FERRITIN, TIBC, IRON, RETICCTPCT in the last 72 hours. Urine analysis:    Component Value Date/Time   COLORURINE YELLOW 11/20/2009 1212   APPEARANCEUR HAZY (A) 11/20/2009 1212   LABSPEC 1.031 (H) 11/20/2009 1212   PHURINE 7.0 11/20/2009 1212   GLUCOSEU NEGATIVE  11/20/2009 1212   HGBUR NEGATIVE 11/20/2009 1212   BILIRUBINUR NEGATIVE 11/20/2009 1212   KETONESUR 15 (A) 11/20/2009 1212   PROTEINUR NEGATIVE 11/20/2009 1212   UROBILINOGEN 0.2 11/20/2009 1212   NITRITE NEGATIVE 11/20/2009 1212   LEUKOCYTESUR TRACE (A) 11/20/2009 1212    Radiological Exams on Admission: Dg Chest 2 View  Result Date: 05/22/2018 CLINICAL DATA:  Chest pain.  Hypertension. EXAM: CHEST - 2 VIEW COMPARISON:  Two-view chest x-ray 01/03/2015 FINDINGS: The heart size and mediastinal contours are within normal limits. Both lungs are clear. The visualized skeletal structures are unremarkable. IMPRESSION: No active cardiopulmonary disease. Electronically Signed   By: Marin Roberts M.D.   On: 05/22/2018 09:23    EKG: Independently reviewed. Vent. rate 90 BPM PR interval 188 ms QRS duration 78 ms QT/QTc 360/440 ms P-R-T axes 71 14 23 Normal sinus rhythm  Assessment/Plan Principal Problem:   TIA (transient ischemic attack) Observation/telemetry. Frequent neuro checks. PT/OT/speech consult. Check swallow screen. Check hemoglobin A1c and fasting lipids. Check echocardiogram. Check CTA/MRI brain. Neurology is following.  Active Problems:   Hypertension Allow permissive hypertension  at this time up to 220/120 mmHg.. Monitor blood pressure closely.    Hyperglycemia This is a nonfasting level. Check fasting glucose in a.m. Check hemoglobin A1c in a.m.   DVT prophylaxis: Lovenox SQ. Code Status: Full code. Family Communication:  Disposition Plan: Observation for TIA work-up. Consults called: The neuro hospitalist team is consulting on the patient. Admission status: Observation/telemetry.   Bobette Mo MD [facility] Pager [phone].  If 7PM-7AM, please contact night-coverage [email or web address] Password [email or web address]  05/22/2018, 3:24 PM

## 2018-05-22 NOTE — ED Notes (Signed)
Results reviewed, no changes in acuity athit simt

## 2018-05-22 NOTE — ED Notes (Signed)
Pt states she is refusing her CT and MRI scans. States she doesn't want to be stuck again for an IV and can't sit in scanner for 45 min. Explained to patient we can given her meds for anxiety. States she feels better, no longer dizzy with numbness in her left arm, thinks she may have slept on it wrong last night. Waiting to update Dr. Criss Alvine.

## 2018-05-22 NOTE — ED Provider Notes (Signed)
[facility] EMERGENCY DEPARTMENT Provider Note   CSN: [id number] Arrival date & time: 05/22/18  2440     History   Chief Complaint Chief Complaint  Patient presents with  . Tachycardia  . Numbness    HPI Sandra Robbins is a 42 y.o. female.  HPI  42 year old female presents with a chief complaint of elevated blood pressure and left arm numbness.  The patient states that she is been dealing with high blood pressure for the last couple weeks and was admitted to an outside hospital and started on Toprol, hydrochlorothiazide, and amlodipine.  She has been compliant with these and took them this morning.  Went to the [facility] emergency department last night due to feeling like her blood pressure was up.  Was discharged.  However was having a hard time sleeping and around 5 AM when she woke up she had left arm heaviness and numbness.  She could lift her arm but it felt heavier than before.  She was also experiencing a little bit of chest pressure that lasted about 20 minutes and was mild.  However the arm numbness continued up until she came here.  In total lasted about 3 hours.  She had some tingling in her left hand. She also noted her blood pressure to be elevated, around 180 systolic at this morning.  There was no headache, blurry vision, or other weakness/numbness.  Past Medical History:  Diagnosis Date  . Hypertension     Patient Active Problem List   Diagnosis Date Noted  . TIA (transient ischemic attack) 05/22/2018  . Hypertension 05/22/2018  . Hyperglycemia 05/22/2018    History reviewed. No pertinent surgical history.   OB History   None      Home Medications    Prior to Admission medications   Medication Sig Start Date End Date Taking? Authorizing Provider  acetaminophen (TYLENOL) 500 MG tablet Take 500 mg by mouth every 6 (six) hours as needed for pain.   Yes [provider]  amLODipine (NORVASC) 5 MG tablet Take 5 mg by  mouth daily. 05/12/18  Yes [provider]  Aspirin-Salicylamide-Caffeine (BC HEADACHE POWDER PO) Take 1 packet by mouth daily as needed. For headache   Yes [provider]  hydrochlorothiazide (HYDRODIURIL) 25 MG tablet Take 25 mg by mouth daily. 05/12/18  Yes [provider]  cyclobenzaprine (FLEXERIL) 10 MG tablet Take 1 tablet (10 mg total) by mouth 2 (two) times daily as needed for muscle spasms. Patient not taking: Reported on 01/03/2015 06/18/13   Fayrene Helper, PA-C  naproxen (NAPROSYN) 500 MG tablet Take 1 tablet (500 mg total) by mouth 2 (two) times daily. Patient not taking: Reported on 01/03/2015 06/18/13   Fayrene Helper, PA-C    Family History Family History  Problem Relation Age of Onset  . Hypertension Mother   . Pancreatic cancer Father 49  . Hypertension Sister   . Heart disease Sister   . Hypertension Sister   . Heart disease Sister   . Hypertension Sister     Social History Social History   Tobacco Use  . Smoking status: Never Smoker  . Smokeless tobacco: Never Used  Substance Use Topics  . Alcohol use: No  . Drug use: No     Allergies   Patient has no known allergies.   Review of Systems Review of Systems  Eyes: Negative for visual disturbance.  Cardiovascular: Positive for chest pain.  Neurological: Positive for weakness and numbness. Negative for  headaches.  All other systems reviewed and are negative.    Physical Exam Updated Vital Signs BP (!) 160/81   Pulse (!) 109   Temp 98.8 F (37.1 C)   Resp (!) 23   LMP 05/06/2018   SpO2 100%   Physical Exam  Constitutional: She is oriented to person, place, and time. She appears well-developed and well-nourished. No distress.  obese  HENT:  Head: Normocephalic and atraumatic.  Right Ear: External ear normal.  Left Ear: External ear normal.  Nose: Nose normal.  Eyes: Right eye exhibits no discharge. Left eye exhibits no discharge.  Cardiovascular: Normal rate, regular  rhythm and normal heart sounds.  Pulmonary/Chest: Effort normal and breath sounds normal.  Abdominal: Soft. There is no tenderness.  Neurological: She is alert and oriented to person, place, and time.  CN 3-12 grossly intact. 5/5 strength in all 4 extremities. Grossly normal sensation. Normal finger to nose.   Skin: Skin is warm and dry. She is not diaphoretic.  Nursing note and vitals reviewed.    ED Treatments / Results  Labs (all labs ordered are listed, but only abnormal results are displayed) Labs Reviewed  BASIC METABOLIC PANEL - Abnormal; Notable for the following components:      Result Value   Glucose, Bld 115 (*)    BUN 5 (*)    All other components within normal limits  CBC  MAGNESIUM  PHOSPHORUS  CBC  TROPONIN I  CREATININE, SERUM  I-STAT TROPONIN, ED  I-STAT BETA HCG BLOOD, ED ([facility], [facility], [facility] ONLY)  I-STAT TROPONIN, ED    EKG EKG Interpretation  Date/Time:  Friday May 22 2018 09:08:56 EDT Ventricular Rate:  90 PR Interval:  188 QRS Duration: 78 QT Interval:  360 QTC Calculation: 440 R Axis:   14 Text Interpretation:  Normal sinus rhythm no acute ST/T changes similar to May 2016 Confirmed by Pricilla Loveless [phone]) on 05/22/2018 12:21:59 PM   Radiology Ct Angio Head W Or Wo Contrast  Result Date: 05/22/2018 CLINICAL DATA:  Focal neuro deficit.  Hypertension and tachycardia. EXAM: CT ANGIOGRAPHY HEAD AND NECK TECHNIQUE: Multidetector CT imaging of the head and neck was performed using the standard protocol during bolus administration of intravenous contrast. Multiplanar CT image reconstructions and MIPs were obtained to evaluate the vascular anatomy. Carotid stenosis measurements (when applicable) are obtained utilizing NASCET criteria, using the distal internal carotid diameter as the denominator. CONTRAST:  50mL ISOVUE-370 IOPAMIDOL (ISOVUE-370) INJECTION 76% COMPARISON:  None. FINDINGS: CT HEAD FINDINGS Brain: No evidence of acute abnormality, including  acute infarct, hemorrhage, hydrocephalus, or mass lesion. Vascular: Negative for hyperdense vessel Skull: Negative Sinuses: Negative Orbits: None Review of the MIP images confirms the above findings CTA NECK FINDINGS Aortic arch: Standard branching. Imaged portion shows no evidence of aneurysm or dissection. No significant stenosis of the major arch vessel origins. Right carotid system: Normal right carotid Left carotid system: Normal left carotid Vertebral arteries: Normal vertebral arteries bilaterally Skeleton: Negative Other neck: Mild goiter.  No soft tissue mass or adenopathy. Upper chest: Negative Review of the MIP images confirms the above findings CTA HEAD FINDINGS Anterior circulation: Normal Posterior circulation:  Normal Venous sinuses: Normal Anatomic variants: None Delayed phase: Normal enhancement. Review of the MIP images confirms the above findings IMPRESSION: Normal CTA head and neck. No evidence of atherosclerotic disease, stenosis, or dissection. Mild goiter Electronically Signed   By: Marlan Palau M.D.   On: 05/22/2018 15:29   Dg Chest 2 View  Result Date:  05/22/2018 CLINICAL DATA:  Chest pain.  Hypertension. EXAM: CHEST - 2 VIEW COMPARISON:  Two-view chest x-ray 01/03/2015 FINDINGS: The heart size and mediastinal contours are within normal limits. Both lungs are clear. The visualized skeletal structures are unremarkable. IMPRESSION: No active cardiopulmonary disease. Electronically Signed   By: Marin Roberts M.D.   On: 05/22/2018 09:23   Ct Angio Neck W And/or Wo Contrast  Result Date: 05/22/2018 CLINICAL DATA:  Focal neuro deficit.  Hypertension and tachycardia. EXAM: CT ANGIOGRAPHY HEAD AND NECK TECHNIQUE: Multidetector CT imaging of the head and neck was performed using the standard protocol during bolus administration of intravenous contrast. Multiplanar CT image reconstructions and MIPs were obtained to evaluate the vascular anatomy. Carotid stenosis measurements (when  applicable) are obtained utilizing NASCET criteria, using the distal internal carotid diameter as the denominator. CONTRAST:  50mL ISOVUE-370 IOPAMIDOL (ISOVUE-370) INJECTION 76% COMPARISON:  None. FINDINGS: CT HEAD FINDINGS Brain: No evidence of acute abnormality, including acute infarct, hemorrhage, hydrocephalus, or mass lesion. Vascular: Negative for hyperdense vessel Skull: Negative Sinuses: Negative Orbits: None Review of the MIP images confirms the above findings CTA NECK FINDINGS Aortic arch: Standard branching. Imaged portion shows no evidence of aneurysm or dissection. No significant stenosis of the major arch vessel origins. Right carotid system: Normal right carotid Left carotid system: Normal left carotid Vertebral arteries: Normal vertebral arteries bilaterally Skeleton: Negative Other neck: Mild goiter.  No soft tissue mass or adenopathy. Upper chest: Negative Review of the MIP images confirms the above findings CTA HEAD FINDINGS Anterior circulation: Normal Posterior circulation:  Normal Venous sinuses: Normal Anatomic variants: None Delayed phase: Normal enhancement. Review of the MIP images confirms the above findings IMPRESSION: Normal CTA head and neck. No evidence of atherosclerotic disease, stenosis, or dissection. Mild goiter Electronically Signed   By: Marlan Palau M.D.   On: 05/22/2018 15:29    Procedures Procedures (including critical care time)  Medications Ordered in ED Medications  iopamidol (ISOVUE-370) 76 % injection (has no administration in time range)   stroke: mapping our early stages of recovery book (has no administration in time range)  acetaminophen (TYLENOL) tablet 650 mg (has no administration in time range)    Or  acetaminophen (TYLENOL) solution 650 mg (has no administration in time range)    Or  acetaminophen (TYLENOL) suppository 650 mg (has no administration in time range)  enoxaparin (LOVENOX) injection 40 mg (40 mg Subcutaneous Not Given 05/22/18 1548)    enoxaparin (LOVENOX) injection 40 mg (has no administration in time range)  ondansetron (ZOFRAN) tablet 4 mg (has no administration in time range)    Or  ondansetron (ZOFRAN) injection 4 mg (has no administration in time range)  acetaminophen (TYLENOL) tablet 650 mg (has no administration in time range)  ondansetron (ZOFRAN) injection 4 mg (has no administration in time range)  enoxaparin (LOVENOX) injection 40 mg (40 mg Subcutaneous Not Given 05/22/18 1544)  iopamidol (ISOVUE-370) 76 % injection 50 mL (50 mLs Intravenous Contrast Given 05/22/18 1502)  LORazepam (ATIVAN) injection 1 mg (1 mg Intravenous Given 05/22/18 1454)     Initial Impression / Assessment and Plan / ED Course  I have reviewed the triage vital signs and the nursing notes.  Pertinent labs & imaging results that were available during my care of the patient were reviewed by me and considered in my medical decision making (see chart for details).     Given the prolonged left arm numbness/subjective weakness she experienced this morning, there is concern for possible neurologic  source.  I discussed with Dr. Amada Jupiter and given her difficult to control blood pressure on 3 different BP meds in addition to the prolonged symptoms, she is a little higher risk for TIA and we should admit her for observation/work-up.  Dr. Robb Matar to admit.  Blood pressure is moderately elevated here although she is currently asymptomatic.  Had some brief chest pain and so has had already had 2 negative troponins.  ECG unremarkable.  My suspicion for dissection is pretty low in this scenario especially given the minimal chest pain she experienced during all this.  Final Clinical Impressions(s) / ED Diagnoses   Final diagnoses:  Left arm numbness    ED Discharge Orders    None       Pricilla Loveless, MD 05/22/18 1600

## 2018-05-22 NOTE — ED Notes (Signed)
Pt now agreeable to CT and MRI. Pts IV started. Ready for transport.

## 2018-05-22 NOTE — ED Notes (Signed)
Patient transported to MRI 

## 2018-05-22 NOTE — ED Triage Notes (Signed)
Pt presents for evaluation of tachycardia and hypertension. Reports she was recently admitted for the same, reports taking medication that was prescribed with normalized BP. States her BP elevated yesterday and she was getting dizzy, nauseated. States was seen in ED last night and released. States doesn't feel better this morning, reports heaviness and tingling to L arm.

## 2018-05-22 NOTE — Consult Note (Addendum)
NEURO HOSPITALIST  CONSULT   Requesting Physician: Dr. Criss Alvine    Chief Complaint: left arm numbness, tachycardia  History obtained from:  Patient     HPI:                                                                                                                                         ALYNE MARTINSON is an 42 y.o. female  With PMH HTN presented to [facility] ed for tachycardia and left arm numbness.   Per patient she has been dealing with High blood pressure for the past few weeks. She was recently placed on Toprol and hydrochlorothiazide. She reports compliance with these medications. Last night she went to [facility] regional ED because she felt that her BP was elevated. She was discharged. This morning she woke up about 0500 and noticed that her left arm was heavy and felt numb. She could lift the arm it just feels heavy. She also reports some chest pressure that lasted about 20 minutes and then resolved. The arm numbness and heaviness continued from 0500-0900 and has now resolved. She took her BP at home and states that  SBP > 180. She did have a moment of  Being light headed.  Denies any SOB, HA, diplopia or other weakness. Denies any ETOH, Drug, smoking.  ED course:  BP: 142/80, BG:115 CTA and MRI Ordered   No prior history of stroke    Date last known well: Date: 05/22/2018 Time last known well: Time: 05:00 tPA Given: No: outside of window  Modified Rankin: Rankin Score=0 NIHSS:0    History reviewed. No pertinent past medical history.  History reviewed. No pertinent surgical history.  No family history on file.      Social History:  reports that she has never smoked. She has never used smokeless tobacco. She reports that she does not drink alcohol or use drugs.  Allergies: No Known Allergies  Medications:  Current Facility-Administered Medications  Medication Dose Route Frequency Provider Last Rate Last Dose  . iopamidol (ISOVUE-370) 76 % injection 50 mL  50 mL Intravenous Once Pricilla Loveless, MD      . iopamidol (ISOVUE-370) 76 % injection           . LORazepam (ATIVAN) injection 1 mg  1 mg Intravenous Once PRN Pricilla Loveless, MD       Current Outpatient Medications  Medication Sig Dispense Refill  . acetaminophen (TYLENOL) 500 MG tablet Take 500 mg by mouth every 6 (six) hours as needed for pain.    . Aspirin-Salicylamide-Caffeine (BC HEADACHE POWDER PO) Take 1 packet by mouth daily as needed. For headache    . cyclobenzaprine (FLEXERIL) 10 MG tablet Take 1 tablet (10 mg total) by mouth 2 (two) times daily as needed for muscle spasms. (Patient not taking: Reported on 01/03/2015) 20 tablet 0  . naproxen (NAPROSYN) 500 MG tablet Take 1 tablet (500 mg total) by mouth 2 (two) times daily. (Patient not taking: Reported on 01/03/2015) 30 tablet 0     ROS:                                                                                                                                       ROS was performed and is negative except as noted in HPI    General Examination:                                                                                                      Blood pressure (!) 142/80, pulse 88, temperature 98.6 F (37 C), temperature source Oral, resp. rate 16, last menstrual period 05/06/2018, SpO2 100 %.  HEENT-  Normocephalic, no lesions, without obvious abnormality.  Normal external eye and conjunctiva.  Cardiovascular- S1-S2 audible, pulses palpable throughout   Lungs-no rhonchi or wheezing noted, no excessive working breathing.  Saturations within normal limits Abdomen- All 4 quadrants palpated and nontender Extremities- Warm, dry and intact Musculoskeletal-no joint tenderness, deformity or swelling Skin-warm and dry, no hyperpigmentation, vitiligo, or suspicious  lesions  Neurological Examination Mental Status: Alert, oriented, thought content appropriate.  Speech fluent without evidence of aphasia.  Able to follow commands without difficulty. Cranial Nerves: II:  Visual fields grossly normal,  III,IV, VI: ptosis not present, extra-ocular motions intact bilaterally, pupils equal, round, reactive to light and accommodation V,VII: smile symmetric, facial light touch sensation normal bilaterally VIII: hearing normal bilaterally IX,X: uvula rises symmetrically XI: bilateral shoulder shrug XII:  midline tongue extension Motor: Right : Upper extremity   5/5 Left:     Upper extremity   5/5  Lower extremity   5/5       Lower extremity   5/5 Tone and bulk:normal tone throughout; no atrophy noted Sensory: light touch intact throughout, bilaterally Deep Tendon Reflexes: 2+ and symmetric biceps and patella Plantars: Right: downgoing   Left: downgoing Cerebellar: normal finger-to-nose, normal rapid alternating movements and normal heel-to-shin test Gait: deferred   Lab Results: Basic Metabolic Panel: Recent Labs  Lab 05/22/18 0908  NA 138  K 3.7  CL 104  CO2 24  GLUCOSE 115*  BUN 5*  CREATININE 0.71  CALCIUM 10.3    CBC: Recent Labs  Lab 05/22/18 0908  WBC 7.4  HGB 13.6  HCT 41.6  MCV 91.2  PLT 330    Imaging: Dg Chest 2 View  Result Date: 05/22/2018 CLINICAL DATA:  Chest pain.  Hypertension. EXAM: CHEST - 2 VIEW COMPARISON:  Two-view chest x-ray 01/03/2015 FINDINGS: The heart size and mediastinal contours are within normal limits. Both lungs are clear. The visualized skeletal structures are unremarkable. IMPRESSION: No active cardiopulmonary disease. Electronically Signed   By: Marin Roberts M.D.   On: 05/22/2018 09:23     Valentina Lucks, MSN, NP-C [facility] [phone]  05/22/2018, 2:39 PM   Attending physician note to follow with Assessment and plan . I have seen the patient reviewed the above  note. She describes numbness of the left arm extending up to the lower face.  Assessment: 42 y.o. female  With PMH HTN presented to [facility] ed for tachycardia and left arm and lower face numbness.  The duration of the symptoms as well as the distribution would argue against a neurapraxia.  No clear evidence supportive of comp located migraine. Given her previously untreated hypertension I think she is at risk for TIA/stroke and I would favor treating this as such at this time.   Impression: Stroke Risk Factors - hypertension    Recommendations:  --MRI Brain  --CTA --Echocardiogram -- ASA 81 -- High intensity Statin if LDL > 70 -- HgbA1c, fasting lipid panel -- PT consult, OT consult, Speech consult --Telemetry monitoring --Frequent neuro checks --Stroke swallow screen  --please page stroke NP  Or  PA  Or MD from 8am -4 pm  as this patient from this time will be  followed by the stroke.   You can look them up on [email or web address]  Password [email or web address]  Ritta Slot, MD [facility] [phone]  If 7pm- 7am, please page neurology on call as listed in AMION.

## 2018-05-23 ENCOUNTER — Observation Stay (HOSPITAL_BASED_OUTPATIENT_CLINIC_OR_DEPARTMENT_OTHER): Payer: Self-pay

## 2018-05-23 ENCOUNTER — Other Ambulatory Visit: Payer: Self-pay

## 2018-05-23 ENCOUNTER — Emergency Department (HOSPITAL_COMMUNITY)
Admission: EM | Admit: 2018-05-23 | Discharge: 2018-05-23 | Disposition: A | Discharge status: Home / Self Care | Payer: Self-pay | Attending: Emergency Medicine | Admitting: Emergency Medicine

## 2018-05-23 ENCOUNTER — Encounter (HOSPITAL_COMMUNITY): Payer: Self-pay

## 2018-05-23 DIAGNOSIS — G459 Transient cerebral ischemic attack, unspecified: Secondary | ICD-10-CM

## 2018-05-23 DIAGNOSIS — Z79899 Other long term (current) drug therapy: Secondary | ICD-10-CM | POA: Insufficient documentation

## 2018-05-23 DIAGNOSIS — I10 Essential (primary) hypertension: Secondary | ICD-10-CM | POA: Insufficient documentation

## 2018-05-23 LAB — BASIC METABOLIC PANEL
ANION GAP: 10 (ref 5–15)
BUN: 8 mg/dL (ref 6–20)
CALCIUM: 9.5 mg/dL (ref 8.9–10.3)
CO2: 27 mmol/L (ref 22–32)
Chloride: 102 mmol/L (ref 98–111)
Creatinine, Ser: 0.69 mg/dL (ref 0.44–1.00)
GLUCOSE: 104 mg/dL — AB (ref 70–99)
Potassium: 3.1 mmol/L — ABNORMAL LOW (ref 3.5–5.1)
Sodium: 139 mmol/L (ref 135–145)

## 2018-05-23 LAB — LIPID PANEL
CHOL/HDL RATIO: 4.3 ratio
Cholesterol: 141 mg/dL (ref 0–200)
HDL: 33 mg/dL — AB (ref >40)
LDL CALC: 89 mg/dL (ref 0–99)
TRIGLYCERIDES: 97 mg/dL (ref <150)
VLDL: 19 mg/dL (ref 0–40)

## 2018-05-23 LAB — ECHOCARDIOGRAM COMPLETE

## 2018-05-23 MED ORDER — ATORVASTATIN CALCIUM 20 MG PO TABS: 20.0000 mg | ORAL_TABLET | Freq: Every day | ORAL | Status: DC

## 2018-05-23 MED ORDER — ASPIRIN EC 81 MG PO TBEC
81.0000 mg | DELAYED_RELEASE_TABLET | Freq: Every day | ORAL | Status: DC
  Administered 2018-05-23: 81 mg via ORAL
  Filled 2018-05-23: qty 1

## 2018-05-23 MED ORDER — CLOPIDOGREL BISULFATE 75 MG PO TABS: 75.0000 mg | ORAL_TABLET | Freq: Every day | ORAL | 0 refills | Status: AC

## 2018-05-23 MED ORDER — ATORVASTATIN CALCIUM 20 MG PO TABS: 20.0000 mg | ORAL_TABLET | Freq: Every day | ORAL | 0 refills | Status: DC

## 2018-05-23 MED ORDER — HYDROCHLOROTHIAZIDE 25 MG PO TABS
25.0000 mg | ORAL_TABLET | Freq: Every day | ORAL | Status: DC
  Administered 2018-05-23: 25 mg via ORAL
  Filled 2018-05-23: qty 1

## 2018-05-23 MED ORDER — CLOPIDOGREL BISULFATE 300 MG PO TABS
300.0000 mg | ORAL_TABLET | Freq: Once | ORAL | Status: AC
  Administered 2018-05-23: 300 mg via ORAL
  Filled 2018-05-23: qty 1

## 2018-05-23 MED ORDER — METOPROLOL TARTRATE 25 MG PO TABS
25.0000 mg | ORAL_TABLET | Freq: Two times a day (BID) | ORAL | Status: DC
  Administered 2018-05-23: 25 mg via ORAL
  Filled 2018-05-23: qty 1

## 2018-05-23 MED ORDER — POTASSIUM CHLORIDE CRYS ER 20 MEQ PO TBCR: 20.0000 meq | EXTENDED_RELEASE_TABLET | Freq: Three times a day (TID) | ORAL | Status: DC

## 2018-05-23 MED ORDER — AMLODIPINE BESYLATE 5 MG PO TABS
5.0000 mg | ORAL_TABLET | Freq: Every day | ORAL | Status: DC
  Administered 2018-05-23: 5 mg via ORAL
  Filled 2018-05-23: qty 1

## 2018-05-23 MED ORDER — MAGNESIUM OXIDE 400 (241.3 MG) MG PO TABS
400.0000 mg | ORAL_TABLET | Freq: Two times a day (BID) | ORAL | Status: DC
  Administered 2018-05-23: 400 mg via ORAL
  Filled 2018-05-23: qty 1

## 2018-05-23 MED ORDER — ASPIRIN 81 MG PO TBEC: 81.0000 mg | DELAYED_RELEASE_TABLET | Freq: Every day | ORAL | 0 refills | Status: DC

## 2018-05-23 MED ORDER — ENSURE ENLIVE PO LIQD: 237.0000 mL | Freq: Two times a day (BID) | ORAL | 12 refills | Status: DC

## 2018-05-23 NOTE — Progress Notes (Addendum)
STROKE TEAM PROGRESS NOTE   HISTORY OF PRESENT ILLNESS (per record) Sandra Robbins is an 42 y.o. female  With PMH HTN presented to [facility] ed for tachycardia and left arm numbness.   Per patient she has been dealing with High blood pressure for the past few weeks. She was recently placed on Toprol and hydrochlorothiazide. She reports compliance with these medications. [facility] she felt that her BP was elevated. She was discharged. This morning she woke up about 0500 and noticed that her left arm was heavy and felt numb. She could lift the arm it just feels heavy. She also reports some chest pressure that lasted about 20 minutes and then resolved. The arm numbness and heaviness continued from 0500-0900 and has now resolved. She took her BP at home and states that  SBP > 180. She did have a moment of  Being light headed.  Denies any SOB, HA, diplopia or other weakness. Denies any ETOH, Drug, smoking.  ED course:  BP: 142/80, BG:115 CTA and MRI Ordered  No prior history of stroke    Date last known well: Date: 05/22/2018 Time last known well: Time: 05:00 tPA Given: No: outside of window  Modified Rankin: Rankin Score=0 NIHSS:0   SUBJECTIVE (INTERVAL HISTORY) Doing well. Back to baseline.    OBJECTIVE Vitals:   05/22/18 1529 05/22/18 1718 05/22/18 2334 05/23/18 0619  BP:  (!) 168/100 (!) 147/83 (!) 141/85  Pulse:  (!) 110 87 91  Resp:  18 16 16   Temp: 98.8 F (37.1 C) 98.2 F (36.8 C) 98.4 F (36.9 C) 98.6 F (37 C)  TempSrc:  Oral Oral Oral  SpO2:  100% 100% 98%    CBC:  Recent Labs  Lab 05/22/18 0908  WBC 7.4  HGB 13.6  HCT 41.6  MCV 91.2  PLT 330    Basic Metabolic Panel:  Recent Labs  Lab 05/22/18 0908 05/22/18 1940 05/23/18 0457  NA 138  --  139  K 3.7  --  3.1*  CL 104  --  102  CO2 24  --  27  GLUCOSE 115*  --  104*  BUN 5*  --  8  CREATININE 0.71 0.73 0.69  CALCIUM 10.3  --  9.5  MG 1.9  --   --   PHOS 3.3   --   --     Lipid Panel:     Component Value Date/Time   CHOL 141 05/23/2018 0457   TRIG 97 05/23/2018 0457   HDL 33 (L) 05/23/2018 0457   CHOLHDL 4.3 05/23/2018 0457   VLDL 19 05/23/2018 0457   LDLCALC 89 05/23/2018 0457   HgbA1c: No results found for: HGBA1C Urine Drug Screen: No results found for: LABOPIA, COCAINSCRNUR, LABBENZ, AMPHETMU, THCU, LABBARB  Alcohol Level No results found for: ETH  IMAGING  Ct Angio Head W Or Wo Contrast Ct Angio Neck W And/or Wo Contrast 05/22/2018 IMPRESSION:  Normal CTA head and neck. No evidence of atherosclerotic disease, stenosis, or dissection. Mild goiter   Dg Chest 2 View 05/22/2018 IMPRESSION:  No active cardiopulmonary disease.    Mr Brain Wo Contrast 05/22/2018 IMPRESSION:  Negative MRI of the brain.      Transthoracic Echocardiogram  05/23/2018 Study Conclusions - Left ventricle: The cavity size was normal. There was mild   concentric hypertrophy. Systolic function was normal. The   estimated ejection fraction was in the range of 55% to 60%. Wall   motion  was normal; there were no regional wall motion   abnormalities. Doppler parameters are consistent with abnormal   left ventricular relaxation (grade 1 diastolic dysfunction). - Mitral valve: Calcified annulus. Mildly thickened leaflets . Impressions: - No cardiac source of emboli was indentified.     PHYSICAL EXAM Blood pressure (!) 141/85, pulse 91, temperature 98.6 F (37 C), temperature source Oral, resp. rate 16, last menstrual period 05/06/2018, SpO2 98 %.  Neuro exam  AAOx4 PERRLA Motor- 5/5 in all extremities Sensory- intact to light touch and pain throughout  Gait- normal Intact finger to nose b/l Intact reflexes 2+ throughout    ASSESSMENT/PLAN Ms. Sandra Robbins is a 42 y.o. female with history of hypertension presenting with left arm numbness / weakness. She did not receive IV t-PA due to late presentation.  Possible TIA:   Resultant   Deficits resolved  CT head - not performed  MRI head - Negative MRI of the brain.   MRA head - not performed  Carotid Doppler - CTA neck performed - carotid dopplers not indicated.  CTA H&N - normal  2D Echo  - EF 55 - 60%. No cardiac source of emboli identified.   LDL - 89  HgbA1c - - 6.0  VTE prophylaxis - Lovenox  Diet  - Heart healthy with thin liquids.  No antithrombotic prior to admission, now on aspirin 81 mg daily  Patient counseled to be compliant with her antithrombotic medications  Ongoing aggressive stroke risk factor management  Therapy recommendations:  No F/U therapy recommended  Disposition:  discharged  Hypertension  BP mildly high . Permissive hypertension (OK if < 220/120) but gradually normalize in 5-7 days . Long-term BP goal normotensive  Hyperlipidemia  Lipid lowering medication PTA:  none  LDL 89, goal < 70  Current lipid lowering medication: Lipitor 20 mg daily  Continue statin at discharge   Other Stroke Risk Factors  ETOH use, advised to drink no more than 1 alcoholic beverage per day.    Other Active Problems  Hypokalemia - 3.1 - supplement   Plan  Add ASA 81 mg daily  Add low dose Lipitor  OK to discharge with normal echo.   TIA Agree with the plan above  Hospital day # 0  Delton See PA-C [facility] Pager [phone] 05/24/2018, 10:19 AM   To contact Stroke Continuity provider, please refer to [email or web address]. After hours, contact General Neurology

## 2018-05-23 NOTE — Discharge Summary (Signed)
Physician Discharge Summary  [name] VHQ:[id number] DOB: 05/05/1976 DOA: 05/22/2018  PCP: [facility]  Admit date: 05/22/2018 Discharge date: 05/23/2018  Admitted From: home Disposition: Home  Recommendations for Outpatient Follow-up:  1. Follow up with incisional care clinic as scheduled on Tuesday 2. Blood pressure checks regularly.  Record them on a sheet and bring them with you to the doctor's appointment  Home Health: No Equipment/Devices: None  Discharge Condition: Improved CODE STATUS: Full code Diet recommendation: Heart Healthy  Brief/Interim Summary: Sandra Robbins is a 42 y.o. female with medical history significant of hypertension who was admitted recently to a different facility due to hypertension and last night he was at [facility] last night for elevated blood pressure as well.  She was discharged and went home, but this morning she is coming to this emergency department with complaints of left arm heaviness and tingling since that started around 5 AM when she woke up, but disappeared after about 3 hours.  He was seen in consultation by neurology who recommended observation to rule out stroke.  Telemetry monitoring has been sinus rhythm since admission.  Given no history of atrial fibrillation will discontinue echocardiogram.  CTA of the head and neck were negative.  MRI of the brain was negative for acute infarction.  EKG showed a normal sinus rhythm.  Seen by PT, OT, and speech therapy.  Physical therapy did think that she would benefit from an antianxiety agent and I do agree.  Believe that this should be started outpatient so she can be followed for response to medication.  Perhaps an SSRI or S NRI would be appropriate.  I have instructed the patient to take her blood pressure daily and record the time date and blood pressure results and to take that with her to the physician's office.   Patient has reached  maximal benefit of hospitalization.  Discharge diagnosis, prognosis, plans, follow-up, medications and treatments discussed with the patient(or responsible party) and is in agreement with the plans as described.  Patient is stable for discharge.  Discharge Diagnoses:  Principal Problem:   TIA (transient ischemic attack) Active Problems:   Hypertension   Hyperglycemia    Discharge Instructions  Discharge Instructions    Call MD for:  difficulty breathing, headache or visual disturbances   Complete by:  As directed    Call MD for:  persistant dizziness or light-headedness   Complete by:  As directed    Diet - low sodium heart healthy   Complete by:  As directed    Discharge instructions   Complete by:  As directed    Keep appointment with transitional clinic on Tuesday Take Aspirin 81 mg daily Take atorvastatin (low dose cholesterol pill) daily Check your blood pressure daily and bring results to your appointment.  Make the result with date and time on a piece of paper   Increase activity slowly   Complete by:  As directed      Allergies as of 05/23/2018   No Known Allergies     Medication List    STOP taking these medications   BC HEADACHE POWDER PO     TAKE these medications   acetaminophen 500 MG tablet Commonly known as:  TYLENOL Take 500 mg by mouth every 6 (six) hours as needed for pain.   amLODipine 5 MG tablet Commonly known as:  NORVASC Take 5 mg by mouth daily.   aspirin 81 MG EC tablet Take  1 tablet (81 mg total) by mouth daily. Start taking on:  05/24/2018   atorvastatin 20 MG tablet Commonly known as:  LIPITOR Take 1 tablet (20 mg total) by mouth daily at 6 PM.   feeding supplement (ENSURE ENLIVE) Liqd Take 237 mLs by mouth 2 (two) times daily between meals.   hydrochlorothiazide 25 MG tablet Commonly known as:  HYDRODIURIL Take 25 mg by mouth daily.   magnesium oxide 400 MG tablet Commonly known as:  MAG-OX Take 400 mg by mouth 2 (two) times  daily.   metoprolol tartrate 25 MG tablet Commonly known as:  LOPRESSOR Take 25 mg by mouth 2 (two) times daily.      Follow-up Information    [facility]. Call in 1 day(s).   Specialty:  Pediatrics Contact information: [address] [place] [address] [phone]          No Known Allergies  Consultations:  Inpatient neurology   Procedures/Studies: Ct Angio Head W Or Wo Contrast  Result Date: 05/22/2018 CLINICAL DATA:  Focal neuro deficit.  Hypertension and tachycardia. EXAM: CT ANGIOGRAPHY HEAD AND NECK TECHNIQUE: Multidetector CT imaging of the head and neck was performed using the standard protocol during bolus administration of intravenous contrast. Multiplanar CT image reconstructions and MIPs were obtained to evaluate the vascular anatomy. Carotid stenosis measurements (when applicable) are obtained utilizing NASCET criteria, using the distal internal carotid diameter as the denominator. CONTRAST:  50mL ISOVUE-370 IOPAMIDOL (ISOVUE-370) INJECTION 76% COMPARISON:  None. FINDINGS: CT HEAD FINDINGS Brain: No evidence of acute abnormality, including acute infarct, hemorrhage, hydrocephalus, or mass lesion. Vascular: Negative for hyperdense vessel Skull: Negative Sinuses: Negative Orbits: None Review of the MIP images confirms the above findings CTA NECK FINDINGS Aortic arch: Standard branching. Imaged portion shows no evidence of aneurysm or dissection. No significant stenosis of the major arch vessel origins. Right carotid system: Normal right carotid Left carotid system: Normal left carotid Vertebral arteries: Normal vertebral arteries bilaterally Skeleton: Negative Other neck: Mild goiter.  No soft tissue mass or adenopathy. Upper chest: Negative Review of the MIP images confirms the above findings CTA HEAD FINDINGS Anterior circulation: Normal Posterior circulation:  Normal Venous sinuses: Normal Anatomic variants: None Delayed phase: Normal  enhancement. Review of the MIP images confirms the above findings IMPRESSION: Normal CTA head and neck. No evidence of atherosclerotic disease, stenosis, or dissection. Mild goiter Electronically Signed   By: Marlan Palau M.D.   On: 05/22/2018 15:29   Dg Chest 2 View  Result Date: 05/22/2018 CLINICAL DATA:  Chest pain.  Hypertension. EXAM: CHEST - 2 VIEW COMPARISON:  Two-view chest x-ray 01/03/2015 FINDINGS: The heart size and mediastinal contours are within normal limits. Both lungs are clear. The visualized skeletal structures are unremarkable. IMPRESSION: No active cardiopulmonary disease. Electronically Signed   By: Marin Roberts M.D.   On: 05/22/2018 09:23   Ct Angio Neck W And/or Wo Contrast  Result Date: 05/22/2018 CLINICAL DATA:  Focal neuro deficit.  Hypertension and tachycardia. EXAM: CT ANGIOGRAPHY HEAD AND NECK TECHNIQUE: Multidetector CT imaging of the head and neck was performed using the standard protocol during bolus administration of intravenous contrast. Multiplanar CT image reconstructions and MIPs were obtained to evaluate the vascular anatomy. Carotid stenosis measurements (when applicable) are obtained utilizing NASCET criteria, using the distal internal carotid diameter as the denominator. CONTRAST:  50mL ISOVUE-370 IOPAMIDOL (ISOVUE-370) INJECTION 76% COMPARISON:  None. FINDINGS: CT HEAD FINDINGS Brain: No evidence of acute abnormality, including acute  infarct, hemorrhage, hydrocephalus, or mass lesion. Vascular: Negative for hyperdense vessel Skull: Negative Sinuses: Negative Orbits: None Review of the MIP images confirms the above findings CTA NECK FINDINGS Aortic arch: Standard branching. Imaged portion shows no evidence of aneurysm or dissection. No significant stenosis of the major arch vessel origins. Right carotid system: Normal right carotid Left carotid system: Normal left carotid Vertebral arteries: Normal vertebral arteries bilaterally Skeleton: Negative Other  neck: Mild goiter.  No soft tissue mass or adenopathy. Upper chest: Negative Review of the MIP images confirms the above findings CTA HEAD FINDINGS Anterior circulation: Normal Posterior circulation:  Normal Venous sinuses: Normal Anatomic variants: None Delayed phase: Normal enhancement. Review of the MIP images confirms the above findings IMPRESSION: Normal CTA head and neck. No evidence of atherosclerotic disease, stenosis, or dissection. Mild goiter Electronically Signed   By: Marlan Palau M.D.   On: 05/22/2018 15:29   Mr Brain Wo Contrast  Result Date: 05/22/2018 CLINICAL DATA:  Focal neuro deficit, greater than 6 hours. EXAM: MRI HEAD WITHOUT CONTRAST TECHNIQUE: Multiplanar, multiecho pulse sequences of the brain and surrounding structures were obtained without intravenous contrast. COMPARISON:  CT head without contrast 05/22/2018. FINDINGS: Brain: No acute infarct, hemorrhage, or mass lesion is present. The ventricles are of normal size. No significant extraaxial fluid collection is present. The internal auditory canals are within normal limits. Brainstem and cerebellum are normal. Vascular: Flow is present in the major intracranial arteries. Skull and upper cervical spine: Skull base is within normal limits. Craniocervical junction is normal. The upper cervical spine is normal. Sinuses/Orbits: The paranasal sinuses and mastoid air cells are clear. Globes and orbits are within normal limits. IMPRESSION: Negative MRI of the brain. Electronically Signed   By: Marin Roberts M.D.   On: 05/22/2018 16:58       Subjective:   Discharge Exam: Vitals:   05/22/18 2334 05/23/18 0619  BP: (!) 147/83 (!) 141/85  Pulse: 87 91  Resp: 16 16  Temp: 98.4 F (36.9 C) 98.6 F (37 C)  SpO2: 100% 98%   Vitals:   05/22/18 1529 05/22/18 1718 05/22/18 2334 05/23/18 0619  BP:  (!) 168/100 (!) 147/83 (!) 141/85  Pulse:  (!) 110 87 91  Resp:  18 16 16   Temp: 98.8 F (37.1 C) 98.2 F (36.8 C) 98.4  F (36.9 C) 98.6 F (37 C)  TempSrc:  Oral Oral Oral  SpO2:  100% 100% 98%    General: Pt is alert, awake, not in acute distress Cardiovascular: RRR, S1/S2 +, no rubs, no gallops Respiratory: CTA bilaterally, no wheezing, no rhonchi Abdominal: Soft, NT, ND, bowel sounds + Extremities: no edema, no cyanosis    The results of significant diagnostics from this hospitalization (including imaging, microbiology, ancillary and laboratory) are listed below for reference.     Microbiology: No results found for this or any previous visit (from the past 240 hour(s)).   Labs: BNP (last 3 results) No results for input(s): BNP in the last 8760 hours. Basic Metabolic Panel: Recent Labs  Lab 05/22/18 0908 05/22/18 1940 05/23/18 0457  NA 138  --  139  K 3.7  --  3.1*  CL 104  --  102  CO2 24  --  27  GLUCOSE 115*  --  104*  BUN 5*  --  8  CREATININE 0.71 0.73 0.69  CALCIUM 10.3  --  9.5  MG 1.9  --   --   PHOS 3.3  --   --  Liver Function Tests: No results for input(s): AST, ALT, ALKPHOS, BILITOT, PROT, ALBUMIN in the last 168 hours. No results for input(s): LIPASE, AMYLASE in the last 168 hours. No results for input(s): AMMONIA in the last 168 hours. CBC: Recent Labs  Lab 05/22/18 0908  WBC 7.4  HGB 13.6  HCT 41.6  MCV 91.2  PLT 330   Cardiac Enzymes: Recent Labs  Lab 05/22/18 1940  TROPONINI <0.03   BNP: Invalid input(s): POCBNP CBG: No results for input(s): GLUCAP in the last 168 hours. D-Dimer No results for input(s): DDIMER in the last 72 hours. Hgb A1c No results for input(s): HGBA1C in the last 72 hours. Lipid Profile Recent Labs    05/23/18 0457  CHOL 141  HDL 33*  LDLCALC 89  TRIG 97  CHOLHDL 4.3   Thyroid function studies No results for input(s): TSH, T4TOTAL, T3FREE, THYROIDAB in the last 72 hours.  Invalid input(s): FREET3 Anemia work up No results for input(s): VITAMINB12, FOLATE, FERRITIN, TIBC, IRON, RETICCTPCT in the last 72  hours. Urinalysis    Component Value Date/Time   COLORURINE YELLOW 11/20/2009 1212   APPEARANCEUR HAZY (A) 11/20/2009 1212   LABSPEC 1.031 (H) 11/20/2009 1212   PHURINE 7.0 11/20/2009 1212   GLUCOSEU NEGATIVE 11/20/2009 1212   HGBUR NEGATIVE 11/20/2009 1212   BILIRUBINUR NEGATIVE 11/20/2009 1212   KETONESUR 15 (A) 11/20/2009 1212   PROTEINUR NEGATIVE 11/20/2009 1212   UROBILINOGEN 0.2 11/20/2009 1212   NITRITE NEGATIVE 11/20/2009 1212   LEUKOCYTESUR TRACE (A) 11/20/2009 1212   Sepsis Labs Invalid input(s): PROCALCITONIN,  WBC,  LACTICIDVEN Microbiology No results found for this or any previous visit (from the past 240 hour(s)).   Time coordinating discharge: 38 minutes   SIGNED:   Lahoma Crocker, MD  FACP [facility] 05/23/2018, 11:22 AM Pager   If 7PM-7AM, please contact night-coverage [email or web address] Password [email or web address]

## 2018-05-23 NOTE — Progress Notes (Signed)
  Echocardiogram 2D Echocardiogram has been performed.  Leta Jungling M 05/23/2018, 11:53 AM

## 2018-05-23 NOTE — ED Provider Notes (Signed)
[facility] EMERGENCY DEPARTMENT Provider Note   CSN: [id number] Arrival date & time: 05/23/18  1653     History   Chief Complaint Chief Complaint  Patient presents with  . Numbness    HPI Sandra Robbins is a 42 y.o. female.  42 year old female with prior medical history as detailed below presents with complaint of left-sided numbness.  She was seen yesterday for the same complaint.  She was admitted for stroke work-up.  Work-up did not reveal significant acute pathology.  She was discharged earlier today.  She reports that just before her reappearance in the ED she had transient left arm and left leg numbness.  She denies weakness.  Symptoms have resolved upon my evaluation.  Symptoms lasted less than 30 minutes.  The history is provided by the patient and medical records.  Illness  This is a recurrent problem. The current episode started less than 1 hour ago. The problem has been resolved. Pertinent negatives include no chest pain, no abdominal pain, no headaches and no shortness of breath. Nothing aggravates the symptoms. Nothing relieves the symptoms.    Past Medical History:  Diagnosis Date  . Headache    "monthly" (05/22/2018)  . Hypertension     Patient Active Problem List   Diagnosis Date Noted  . TIA (transient ischemic attack) 05/22/2018  . Hypertension 05/22/2018  . Hyperglycemia 05/22/2018    Past Surgical History:  Procedure Laterality Date  . TUBAL LIGATION  2000     OB History   None      Home Medications    Prior to Admission medications   Medication Sig Start Date End Date Taking? Authorizing Provider  acetaminophen (TYLENOL) 500 MG tablet Take 500 mg by mouth every 6 (six) hours as needed for pain.   Yes [provider]  amLODipine (NORVASC) 5 MG tablet Take 5 mg by mouth daily. 05/12/18  Yes [provider]  hydrochlorothiazide (HYDRODIURIL) 25 MG tablet Take 25 mg by mouth daily. 05/12/18  Yes [provider]  magnesium oxide (MAG-OX) 400 MG tablet Take 400 mg by mouth 2 (two) times daily.   Yes [provider]  metoprolol tartrate (LOPRESSOR) 25 MG tablet Take 25 mg by mouth 2 (two) times daily.   Yes [provider]  aspirin EC 81 MG EC tablet Take 1 tablet (81 mg total) by mouth daily. 05/24/18   Lahoma Crocker, MD  atorvastatin (LIPITOR) 20 MG tablet Take 1 tablet (20 mg total) by mouth daily at 6 PM. 05/23/18   Lahoma Crocker, MD  clopidogrel (PLAVIX) 75 MG tablet Take 1 tablet (75 mg total) by mouth daily for 21 days. 05/23/18 06/13/18  Wynetta Fines, MD  feeding supplement, ENSURE ENLIVE, (ENSURE ENLIVE) LIQD Take 237 mLs by mouth 2 (two) times daily between meals. 05/23/18   Lahoma Crocker, MD    Family History Family History  Problem Relation Age of Onset  . Hypertension Mother   . Pancreatic cancer Father 92  . Hypertension Sister   . Heart disease Sister   . Hypertension Sister   . Heart disease Sister   . Hypertension Sister     Social History Social History   Tobacco Use  . Smoking status: Never Smoker  . Smokeless tobacco: Never Used  Substance Use Topics  . Alcohol use: Not Currently  . Drug use: Never     Allergies   Patient has no known allergies.   Review of Systems Review  of Systems  Respiratory: Negative for shortness of breath.   Cardiovascular: Negative for chest pain.  Gastrointestinal: Negative for abdominal pain.  Neurological: Negative for headaches.  All other systems reviewed and are negative.    Physical Exam Updated Vital Signs BP (!) 145/95 (BP Location: Right Arm)   Pulse 80   Temp 98.5 F (36.9 C) (Oral)   Resp 16   Ht 5\' 7"  (1.702 m)   Wt 94.8 kg   LMP 05/06/2018 (Exact Date)   SpO2 100%   BMI 32.73 kg/m   Physical Exam  Constitutional: She is oriented to person, place, and time. She appears well-developed and well-nourished. No distress.  HENT:  Head: Normocephalic and atraumatic.   Mouth/Throat: Oropharynx is clear and moist.  Eyes: Pupils are equal, round, and reactive to light. Conjunctivae and EOM are normal.  Neck: Normal range of motion. Neck supple.  Cardiovascular: Normal rate, regular rhythm and normal heart sounds.  Pulmonary/Chest: Effort normal and breath sounds normal. No respiratory distress.  Abdominal: Soft. She exhibits no distension. There is no tenderness.  Musculoskeletal: Normal range of motion. She exhibits no edema or deformity.  Neurological: She is alert and oriented to person, place, and time. She displays normal reflexes. No cranial nerve deficit or sensory deficit. She exhibits normal muscle tone. Coordination normal.  Alert and oriented x4 Normal speech No facial droop 5 out of 5 strength in both upper and lower extremities VAN negative  Skin: Skin is warm and dry.  Psychiatric: She has a normal mood and affect.  Nursing note and vitals reviewed.    ED Treatments / Results  Labs (all labs ordered are listed, but only abnormal results are displayed) Labs Reviewed - No data to display  EKG None  Radiology Ct Angio Head W Or Wo Contrast  Result Date: 05/22/2018 CLINICAL DATA:  Focal neuro deficit.  Hypertension and tachycardia. EXAM: CT ANGIOGRAPHY HEAD AND NECK TECHNIQUE: Multidetector CT imaging of the head and neck was performed using the standard protocol during bolus administration of intravenous contrast. Multiplanar CT image reconstructions and MIPs were obtained to evaluate the vascular anatomy. Carotid stenosis measurements (when applicable) are obtained utilizing NASCET criteria, using the distal internal carotid diameter as the denominator. CONTRAST:  50mL ISOVUE-370 IOPAMIDOL (ISOVUE-370) INJECTION 76% COMPARISON:  None. FINDINGS: CT HEAD FINDINGS Brain: No evidence of acute abnormality, including acute infarct, hemorrhage, hydrocephalus, or mass lesion. Vascular: Negative for hyperdense vessel Skull: Negative Sinuses:  Negative Orbits: None Review of the MIP images confirms the above findings CTA NECK FINDINGS Aortic arch: Standard branching. Imaged portion shows no evidence of aneurysm or dissection. No significant stenosis of the major arch vessel origins. Right carotid system: Normal right carotid Left carotid system: Normal left carotid Vertebral arteries: Normal vertebral arteries bilaterally Skeleton: Negative Other neck: Mild goiter.  No soft tissue mass or adenopathy. Upper chest: Negative Review of the MIP images confirms the above findings CTA HEAD FINDINGS Anterior circulation: Normal Posterior circulation:  Normal Venous sinuses: Normal Anatomic variants: None Delayed phase: Normal enhancement. Review of the MIP images confirms the above findings IMPRESSION: Normal CTA head and neck. No evidence of atherosclerotic disease, stenosis, or dissection. Mild goiter Electronically Signed   By: Marlan Palau M.D.   On: 05/22/2018 15:29   Dg Chest 2 View  Result Date: 05/22/2018 CLINICAL DATA:  Chest pain.  Hypertension. EXAM: CHEST - 2 VIEW COMPARISON:  Two-view chest x-ray 01/03/2015 FINDINGS: The heart size and mediastinal contours are within normal limits. Both  lungs are clear. The visualized skeletal structures are unremarkable. IMPRESSION: No active cardiopulmonary disease. Electronically Signed   By: Marin Roberts M.D.   On: 05/22/2018 09:23   Ct Angio Neck W And/or Wo Contrast  Result Date: 05/22/2018 CLINICAL DATA:  Focal neuro deficit.  Hypertension and tachycardia. EXAM: CT ANGIOGRAPHY HEAD AND NECK TECHNIQUE: Multidetector CT imaging of the head and neck was performed using the standard protocol during bolus administration of intravenous contrast. Multiplanar CT image reconstructions and MIPs were obtained to evaluate the vascular anatomy. Carotid stenosis measurements (when applicable) are obtained utilizing NASCET criteria, using the distal internal carotid diameter as the denominator. CONTRAST:   50mL ISOVUE-370 IOPAMIDOL (ISOVUE-370) INJECTION 76% COMPARISON:  None. FINDINGS: CT HEAD FINDINGS Brain: No evidence of acute abnormality, including acute infarct, hemorrhage, hydrocephalus, or mass lesion. Vascular: Negative for hyperdense vessel Skull: Negative Sinuses: Negative Orbits: None Review of the MIP images confirms the above findings CTA NECK FINDINGS Aortic arch: Standard branching. Imaged portion shows no evidence of aneurysm or dissection. No significant stenosis of the major arch vessel origins. Right carotid system: Normal right carotid Left carotid system: Normal left carotid Vertebral arteries: Normal vertebral arteries bilaterally Skeleton: Negative Other neck: Mild goiter.  No soft tissue mass or adenopathy. Upper chest: Negative Review of the MIP images confirms the above findings CTA HEAD FINDINGS Anterior circulation: Normal Posterior circulation:  Normal Venous sinuses: Normal Anatomic variants: None Delayed phase: Normal enhancement. Review of the MIP images confirms the above findings IMPRESSION: Normal CTA head and neck. No evidence of atherosclerotic disease, stenosis, or dissection. Mild goiter Electronically Signed   By: Marlan Palau M.D.   On: 05/22/2018 15:29   Mr Brain Wo Contrast  Result Date: 05/22/2018 CLINICAL DATA:  Focal neuro deficit, greater than 6 hours. EXAM: MRI HEAD WITHOUT CONTRAST TECHNIQUE: Multiplanar, multiecho pulse sequences of the brain and surrounding structures were obtained without intravenous contrast. COMPARISON:  CT head without contrast 05/22/2018. FINDINGS: Brain: No acute infarct, hemorrhage, or mass lesion is present. The ventricles are of normal size. No significant extraaxial fluid collection is present. The internal auditory canals are within normal limits. Brainstem and cerebellum are normal. Vascular: Flow is present in the major intracranial arteries. Skull and upper cervical spine: Skull base is within normal limits. Craniocervical  junction is normal. The upper cervical spine is normal. Sinuses/Orbits: The paranasal sinuses and mastoid air cells are clear. Globes and orbits are within normal limits. IMPRESSION: Negative MRI of the brain. Electronically Signed   By: Marin Roberts M.D.   On: 05/22/2018 16:58    Procedures Procedures (including critical care time)  Medications Ordered in ED Medications  clopidogrel (PLAVIX) tablet 300 mg (has no administration in time range)     Initial Impression / Assessment and Plan / ED Course  I have reviewed the triage vital signs and the nursing notes.  Pertinent labs & imaging results that were available during my care of the patient were reviewed by me and considered in my medical decision making (see chart for details).     MDM  Screen complete  Patient is presenting for reevaluation of transient left-sided numbness.  There was no associated weakness or other neurologic finding on exam.  She reports that symptoms resolved rapidly prior to provider evaluation.  She was just discharged earlier today after a work-up for the same complaint.  Case was discussed with Dr. Amada Jupiter, Neurology, who evaluated the patient yesterday.  He does not feel that additional imaging or other  studies is required at this time.  He does recommend that the patient be on dual platelet therapy for the next 3 weeks - aspirin and Plavix.  She is to be given a loading dose of Plavix today.  Patient understands need for close follow-up.  She appears to be stable for discharge.  Strict return precautions are given and understood.  Final Clinical Impressions(s) / ED Diagnoses   Final diagnoses:  TIA (transient ischemic attack)    ED Discharge Orders         Ordered    clopidogrel (PLAVIX) 75 MG tablet  Daily     05/23/18 1848           Wynetta Fines, MD 05/23/18 1853

## 2018-05-23 NOTE — Evaluation (Signed)
Physical Therapy Evaluation Patient Details Name: Sandra Robbins MRN: [id number] DOB: 02-25-1976 Today's Date: 05/23/2018   History of Present Illness  Sandra Robbins is an 42 y.o. female  With PMH HTN presented to [facility] ed for tachycardia and left arm numbness. MRI neg.   Clinical Impression  Patient evaluated by Physical Therapy with no further acute PT needs identified. All education has been completed and the patient has no further questions. Pt ambulated and performed a flight of stairs without difficulty however, HR up to 133 bpm with normal pace walking and DOE 2/4. After 5 mins seated rest HR still 117 bpm. Pt also noted to be struggling with anxiety with some issues with her daughter and with current medical issues. Pt's symptoms have resolved, no further acute PT needs.   See below for any follow-up Physical Therapy or equipment needs. PT is signing off. Thank you for this referral.     Follow Up Recommendations No PT follow up    Equipment Recommendations  None recommended by PT    Recommendations for Other Services       Precautions / Restrictions Precautions Precautions: None Restrictions Weight Bearing Restrictions: No      Mobility  Bed Mobility Overal bed mobility: Independent                Transfers Overall transfer level: Independent Equipment used: None                Ambulation/Gait Ambulation/Gait assistance: Independent Gait Distance (Feet): 500 Feet Assistive device: None Gait Pattern/deviations: WFL(Within Functional Limits) Gait velocity: WFL Gait velocity interpretation: >4.37 ft/sec, indicative of normal walking speed General Gait Details: no gait abnormalities though pt with mild SOB and HR up to 133 bpm with normal pace walking. O2 sats 95%.   Stairs Stairs: Yes Stairs assistance: Independent Stair Management: One rail Right;Alternating pattern;Forwards Number of Stairs: 10 General stair comments: 2/4 DOE with  stairs  Wheelchair Mobility    Modified Rankin (Stroke Patients Only) Modified Rankin (Stroke Patients Only) Pre-Morbid Rankin Score: No symptoms Modified Rankin: No symptoms     Balance Overall balance assessment: Independent                                           Pertinent Vitals/Pain Pain Assessment: No/denies pain    Home Living Family/patient expects to be discharged to:: Private residence Living Arrangements: Children Available Help at Discharge: Family;Available PRN/intermittently Type of Home: House Home Access: Stairs to enter   Entrance Stairs-Number of Steps: 8 Home Layout: One level Home Equipment: None Additional Comments: pt lives with her 2 daughters, one works full time and one has cognitive needs but is physically independent. She has a fiance that does not live there but helps as needed.     Prior Function Level of Independence: Independent         Comments: she is currently running a machine, 12 hr shifts     Hand Dominance        Extremity/Trunk Assessment   Upper Extremity Assessment Upper Extremity Assessment: Overall WFL for tasks assessed    Lower Extremity Assessment Lower Extremity Assessment: Overall WFL for tasks assessed    Cervical / Trunk Assessment Cervical / Trunk Assessment: Normal  Communication   Communication: No difficulties  Cognition Arousal/Alertness: Awake/alert Behavior During Therapy: Anxious Overall Cognitive Status: Within Functional Limits for tasks assessed  General Comments: pt with increased anxiety over current medical issues but in bigger picture has been dealing with anxiety since her daughter was diagnosed with schizophrenia several months ago.       General Comments General comments (skin integrity, edema, etc.): discussed lifestyle changes including regular exercise (walking) and diet changes as well as managing stress and  addressing anxiety. Pt working to find a PCP to help her with theses things    Exercises     Assessment/Plan    PT Assessment Patent does not need any further PT services  PT Problem List         PT Treatment Interventions      PT Goals (Current goals can be found in the Care Plan section)  Acute Rehab PT Goals Patient Stated Goal: return to home and work PT Goal Formulation: All assessment and education complete, DC therapy    Frequency     Barriers to discharge        Co-evaluation               AM-PAC PT "6 Clicks" Daily Activity  Outcome Measure Difficulty turning over in bed (including adjusting bedclothes, sheets and blankets)?: None Difficulty moving from lying on back to sitting on the side of the bed? : None Difficulty sitting down on and standing up from a chair with arms (e.g., wheelchair, bedside commode, etc,.)?: None Help needed moving to and from a bed to chair (including a wheelchair)?: None Help needed walking in hospital room?: None Help needed climbing 3-5 steps with a railing? : None 6 Click Score: 24    End of Session   Activity Tolerance: Patient tolerated treatment well Patient left: in chair;with call bell/phone within reach Nurse Communication: Mobility status PT Visit Diagnosis: Unsteadiness on feet (R26.81)    Time: 1610-9604 PT Time Calculation (min) (ACUTE ONLY): 34 min   Charges:   PT Evaluation $PT Eval Moderate Complexity: 1 Mod PT Treatments $Gait Training: 8-22 mins        Lyanne Co, PT  Acute Rehab Services  Pager [phone] Office [phone]   Benetta Spar L Beyla Loney 05/23/2018, 10:08 AM

## 2018-05-23 NOTE — Discharge Instructions (Addendum)
Please return for any problem.  Follow-up with your regular doctors as previously instructed.  Please take aspirin on a daily basis.  Please take Plavix as prescribed on a daily basis for the next 3 weeks.  After 3 weeks you will no longer need to take Plavix and can continue with aspirin alone.

## 2018-05-23 NOTE — ED Triage Notes (Signed)
Pt endorses numbness down left side of body that began at 1630 this afternoon. Pt was here yesterday and admitted for the same. Pt states that it went away and came back 30 minutes ago. Upon stroke exam, pt states that there is no different when both sides are touched. NIH 0. No weakness, slurred speech, dizziness, or visual changes. VSS. Axox4.

## 2018-05-23 NOTE — Progress Notes (Signed)
OT Cancellation Note  Patient Details Name: GWYNETH FERNANDEZ MRN: [id number] DOB: October 23, 1975   Cancelled Treatment:    Reason Eval/Treat Not Completed: OT screened, no needs identified, will sign off.  Per PT, pt is at baseline, and imaging negative for acute infarct.  Jeani Hawking, OTR/L Acute Rehabilitation Services Pager [phone] Office [phone]   Jeani Hawking M 05/23/2018, 11:33 AM

## 2018-05-23 NOTE — Progress Notes (Signed)
D/c reviewed with patient. No further questions at this time 

## 2018-05-24 ENCOUNTER — Telehealth: Payer: Self-pay | Admitting: Surgery

## 2018-05-24 LAB — HEMOGLOBIN A1C
Hgb A1c MFr Bld: 6 % — ABNORMAL HIGH (ref 4.8–5.6)
Mean Plasma Glucose: 126 mg/dL

## 2018-05-24 LAB — HIV ANTIBODY (ROUTINE TESTING W REFLEX): HIV SCREEN 4TH GENERATION: NONREACTIVE

## 2018-05-24 NOTE — Telephone Encounter (Signed)
ED CM attempted to contact patient concerning medication assistance referral. No answer and CM unable to LVM. CM will make 2nd attempt later today.

## 2020-02-11 ENCOUNTER — Other Ambulatory Visit: Payer: Self-pay

## 2020-02-11 ENCOUNTER — Inpatient Hospital Stay: Payer: 59

## 2020-02-11 ENCOUNTER — Inpatient Hospital Stay: Payer: 59 | Attending: Hematology and Oncology | Admitting: Hematology and Oncology

## 2020-02-11 VITALS — BP 151/86 | HR 81 | Temp 97.5°F | Resp 18 | Ht 67.0 in | Wt 199.9 lb

## 2020-02-11 DIAGNOSIS — Z79899 Other long term (current) drug therapy: Secondary | ICD-10-CM | POA: Insufficient documentation

## 2020-02-11 DIAGNOSIS — D708 Other neutropenia: Secondary | ICD-10-CM

## 2020-02-11 DIAGNOSIS — Z800 Family history of malignant neoplasm of digestive organs: Secondary | ICD-10-CM | POA: Diagnosis not present

## 2020-02-11 DIAGNOSIS — I10 Essential (primary) hypertension: Secondary | ICD-10-CM | POA: Insufficient documentation

## 2020-02-11 DIAGNOSIS — D72819 Decreased white blood cell count, unspecified: Secondary | ICD-10-CM | POA: Insufficient documentation

## 2020-02-11 LAB — CMP (CANCER CENTER ONLY)
ALT: 20 U/L (ref 0–44)
AST: 15 U/L (ref 15–41)
Albumin: 4.1 g/dL (ref 3.5–5.0)
Alkaline Phosphatase: 65 U/L (ref 38–126)
Anion gap: 8 (ref 5–15)
BUN: 10 mg/dL (ref 6–20)
CO2: 30 mmol/L (ref 22–32)
Calcium: 10 mg/dL (ref 8.9–10.3)
Chloride: 102 mmol/L (ref 98–111)
Creatinine: 0.8 mg/dL (ref 0.44–1.00)
GFR, Est AFR Am: >60 mL/min (ref >60)
GFR, Estimated: >60 mL/min (ref >60)
Glucose, Bld: 93 mg/dL (ref 70–99)
Potassium: 3.6 mmol/L (ref 3.5–5.1)
Sodium: 140 mmol/L (ref 135–145)
Total Bilirubin: 0.4 mg/dL (ref 0.3–1.2)
Total Protein: 7.7 g/dL (ref 6.5–8.1)

## 2020-02-11 LAB — CBC WITH DIFFERENTIAL (CANCER CENTER ONLY)
Abs Immature Granulocytes: 0.01 10*3/uL (ref 0.00–0.07)
Basophils Absolute: 0 10*3/uL (ref 0.0–0.1)
Basophils Relative: 1 %
Eosinophils Absolute: 0.1 10*3/uL (ref 0.0–0.5)
Eosinophils Relative: 3 %
HCT: 42.2 % (ref 36.0–46.0)
Hemoglobin: 14.2 g/dL (ref 12.0–15.0)
Immature Granulocytes: 0 %
Lymphocytes Relative: 54 %
Lymphs Abs: 2.1 10*3/uL (ref 0.7–4.0)
MCH: 30.3 pg (ref 26.0–34.0)
MCHC: 33.6 g/dL (ref 30.0–36.0)
MCV: 90.2 fL (ref 80.0–100.0)
Monocytes Absolute: 0.4 10*3/uL (ref 0.1–1.0)
Monocytes Relative: 10 %
Neutro Abs: 1.3 10*3/uL — ABNORMAL LOW (ref 1.7–7.7)
Neutrophils Relative %: 32 %
Platelet Count: 257 10*3/uL (ref 150–400)
RBC: 4.68 MIL/uL (ref 3.87–5.11)
RDW: 12.5 % (ref 11.5–15.5)
WBC Count: 3.9 10*3/uL — ABNORMAL LOW (ref 4.0–10.5)
nRBC: 0 % (ref 0.0–0.2)

## 2020-02-11 LAB — SAVE SMEAR(SSMR), FOR PROVIDER SLIDE REVIEW

## 2020-02-11 LAB — LACTATE DEHYDROGENASE: LDH: 147 U/L (ref 98–192)

## 2020-02-11 LAB — HEPATITIS C ANTIBODY: HCV Ab: NONREACTIVE

## 2020-02-11 LAB — VITAMIN B12: Vitamin B-12: 173 pg/mL — ABNORMAL LOW (ref 180–914)

## 2020-02-11 LAB — HEPATITIS B SURFACE ANTIGEN: Hepatitis B Surface Ag: NONREACTIVE

## 2020-02-11 LAB — FOLATE: Folate: 10.7 ng/mL (ref >5.9)

## 2020-02-11 NOTE — Progress Notes (Signed)
[place] [phone]   Fax:([phone]) [place] NOTE  Patient Care Team: Inc, [facility] as PCP - General (Pediatrics)  Hematological/Oncological History # Leukopenia 1) 01/03/2015: WBC 4.9, Hgb 12.8, MCV 90.1, Plt 256 2) 05/22/2018: WBC 7.4, Hgb 13.6, MCV 91.2, Plt 330 3) 01/06/2020: WBC 3.1, Hgb 12.3, Plt 248, MCV 89.8 4) 02/11/2020: establish care with Dr. Lorenso Courier   CHIEF COMPLAINTS/PURPOSE OF CONSULTATION:  " Leukopenia "  HISTORY OF PRESENTING ILLNESS:  Sandra Robbins 44 y.o. female with medical history significant for HTN who presents for evaluation of leukopenia/neutropenia.   On review of the previous records [name] has 2 prior CBCs in our system.  On 01/03/2015 she was found to have white blood cell count of 4.9 hemoglobin of 12.8, MCV of 90.1, and a platelet count of 256.  On 05/22/2018 the patient was found to have white blood cell count 7.4, hemoglobin 13.6, MCV of 91.2, and a platelet count of 330.  Most recently with the patient's primary care provider on 01/06/2020 she was found to have white blood cell count of 3.1, hemoglobin 12.3, platelet of 248, and MCV of 89.8.  The differential at that time showed a lymphocytic predominance with relative neutropenia.  Due to concern for this patient's sudden low white blood cell count she was referred to hematology for further evaluation management.  On exam today [name] notes that physically she has been well.  Unfortunately she has been under a great deal of stress because her fianc passed away from chronic kidney disease and congestive heart failure in April of this year.  She notes that she has been having some stuffiness in the nose and the pollen has been bothering her more than usual this year.  She denies having any issues with fevers, chills, sweats, nausea, vomiting or diarrhea.  She notes that her weight has been stable over the last several months.  On  further discussion she notes that she has never had an issue with her blood before.  She reports that she has never had any issues with anemia and has never had to take any nutritional supplementation for her blood counts.  She has no family history remarkable for blood diseases though she does have a father that passed away of pancreatic cancer in maternal grandmother that died of ovarian cancer.  In terms of diet the patient notes that she has tried to cut back on red meat and tries to eat more lean meats.  She notes that she is avoiding pork, but does eat fish and chicken and no fried foods.  She has been increasing her intake of raw vegetable smoothies.  Otherwise she denies any shortness of breath, chest pain, recurrent infections, pneumonia, or any need for antibiotics in the recent past.  A full 10 point ROS is listed below.  MEDICAL HISTORY:  Past Medical History:  Diagnosis Date  . Headache    "monthly" (05/22/2018)  . Hypertension     SURGICAL HISTORY: Past Surgical History:  Procedure Laterality Date  . TUBAL LIGATION  2000    SOCIAL HISTORY: Social History   Socioeconomic History  . Marital status: Single    Spouse name: Not on file  . Number of children: Not on file  . Years of education: Not on file  . Highest education level: Not on file  Occupational History  . Not on file  Tobacco Use  . Smoking status: Never Smoker  . Smokeless  tobacco: Never Used  Vaping Use  . Vaping Use: Never used  Substance and Sexual Activity  . Alcohol use: Not Currently  . Drug use: Never  . Sexual activity: Not Currently  Other Topics Concern  . Not on file  Social History Narrative  . Not on file   Social Determinants of Health   Financial Resource Strain:   . Difficulty of Paying Living Expenses:   Food Insecurity:   . Worried About Charity fundraiser in the Last Year:   . Arboriculturist in the Last Year:   Transportation Needs:   . Film/video editor (Medical):     Marland Kitchen Lack of Transportation (Non-Medical):   Physical Activity:   . Days of Exercise per Week:   . Minutes of Exercise per Session:   Stress:   . Feeling of Stress :   Social Connections:   . Frequency of Communication with Friends and Family:   . Frequency of Social Gatherings with Friends and Family:   . Attends Religious Services:   . Active Member of Clubs or Organizations:   . Attends Archivist Meetings:   Marland Kitchen Marital Status:   Intimate Partner Violence:   . Fear of Current or Ex-Partner:   . Emotionally Abused:   Marland Kitchen Physically Abused:   . Sexually Abused:     FAMILY HISTORY: Family History  Problem Relation Age of Onset  . Hypertension Mother   . Pancreatic cancer Father 36  . Hypertension Sister   . Heart disease Sister   . Hypertension Sister   . Heart disease Sister   . Hypertension Sister     ALLERGIES:  has No Known Allergies.  MEDICATIONS:  Current Outpatient Medications  Medication Sig Dispense Refill  . amLODipine (NORVASC) 10 MG tablet Take 10 mg by mouth daily.    . hydrochlorothiazide (HYDRODIURIL) 25 MG tablet Take 25 mg by mouth daily.  0  . acetaminophen (TYLENOL) 500 MG tablet Take 500 mg by mouth every 6 (six) hours as needed for pain.    . metoprolol tartrate (LOPRESSOR) 25 MG tablet Take 25 mg by mouth 2 (two) times daily.     No current facility-administered medications for this visit.    REVIEW OF SYSTEMS:   Constitutional: ( - ) fevers, ( - )  chills , ( - ) night sweats Eyes: ( - ) blurriness of vision, ( - ) double vision, ( - ) watery eyes Ears, nose, mouth, throat, and face: ( - ) mucositis, ( - ) sore throat Respiratory: ( - ) cough, ( - ) dyspnea, ( - ) wheezes Cardiovascular: ( - ) palpitation, ( - ) chest discomfort, ( - ) lower extremity swelling Gastrointestinal:  ( - ) nausea, ( - ) heartburn, ( - ) change in bowel habits Skin: ( - ) abnormal skin rashes Lymphatics: ( - ) new lymphadenopathy, ( - ) easy  bruising Neurological: ( - ) numbness, ( - ) tingling, ( - ) new weaknesses Behavioral/Psych: ( - ) mood change, ( - ) new changes  All other systems were reviewed with the patient and are negative.  PHYSICAL EXAMINATION: ECOG PERFORMANCE STATUS: 0 - Asymptomatic  Vitals:   02/11/20 0817  BP: (!) 151/86  Pulse: 81  Resp: 18  Temp: (!) 97.5 F (36.4 C)  SpO2: 100%   Filed Weights   02/11/20 0817  Weight: 199 lb 14.4 oz (90.7 kg)    GENERAL: well appearing middle aged African  American female in NAD  SKIN: skin color, texture, turgor are normal, no rashes or significant lesions EYES: conjunctiva are pink and non-injected, sclera clear NECK: supple, non-tender. Thyroid upper limit of normal. LYMPH:  no palpable lymphadenopathy in the cervical lymph node chains LUNGS: clear to auscultation and percussion with normal breathing effort HEART: regular rate & rhythm and no murmurs and no lower extremity edema ABDOMEN: soft, non-tender, non-distended, normal bowel sounds. No HSM appreciated.  Musculoskeletal: no cyanosis of digits and no clubbing  PSYCH: alert & oriented x 3, fluent speech NEURO: no focal motor/sensory deficits  LABORATORY DATA:  I have reviewed the data as listed CBC Latest Ref Rng & Units 05/22/2018 01/03/2015  WBC 4.0 - 10.5 K/uL 7.4 4.9  Hemoglobin 12.0 - 15.0 g/dL 13.6 12.8  Hematocrit 36 - 46 % 41.6 39.3  Platelets 150 - 400 K/uL 330 256    CMP Latest Ref Rng & Units 05/23/2018 05/22/2018 05/22/2018  Glucose 70 - 99 mg/dL 104(H) - 115(H)  BUN 6 - 20 mg/dL 8 - 5(L)  Creatinine 0.44 - 1.00 mg/dL 0.69 0.73 0.71  Sodium 135 - 145 mmol/L 139 - 138  Potassium 3.5 - 5.1 mmol/L 3.1(L) - 3.7  Chloride 98 - 111 mmol/L 102 - 104  CO2 22 - 32 mmol/L 27 - 24  Calcium 8.9 - 10.3 mg/dL 9.5 - 10.3   BLOOD FILM:  Review of the peripheral blood smear showed normal appearing white cells (though decreased in number) with neutrophils that were appropriately lobated and  granulated. There was no predominance of bi-lobed or hyper-segmented neutrophils appreciated. No Dohle bodies were noted. There was no left shifting, immature forms or blasts noted. Lymphocytes remain normal in size without any predominance of large granular lymphocytes. Red cells show no anisopoikilocytosis, macrocytes , microcytes or polychromasia. There were no schistocytes, target cells, echinocytes, acanthocytes, dacrocytes, or stomatocytes.There was no rouleaux formation, nucleated red cells, or intra-cellular inclusions noted. The platelets are normal in size, shape, and color without any clumping evident.  RADIOGRAPHIC STUDIES: None relevant to review.  No results found.  ASSESSMENT & PLAN ANAIRA SEAY 44 y.o. female with medical history significant for HTN who presents for evaluation of leukopenia/neutropenia.  After review the labs, discussion with the patient, and review of the outside records the etiology of the patient's leukopenia/neutropenia is not entirely clear.  This is a single isolated reading of a white blood cell count of 3.1.  She has had no recent changes in medications or any recent health issues that would help to explain why her white blood cell count has so suddenly dropped.  Today we will do a full work-up to include nutritional evaluation, viral work-up, and review of the peripheral blood film.  Given that her other cell lines are within normal limits I have a low suspicion for a primary bone marrow disorder, however if the neutrophil count continues to drop we would need to consider bone marrow biopsy to further evaluate.  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  We will plan to have her return to clinic in 6 months time to reevaluate if her white blood cell count is stable, however if it is declining we would need to consider further evaluation.  #  Leukopenia/Neutropenia --today will order CBC, CMP, LDH and peripheral blood film -- nutritional evaluation with copper vitamin b12, folate, MMA, and  homocysteine --viral workup with Hep B and Hep C. HIV tested negative in 2019 --recent thyroid studies with PCP unremarkable --if WBC is worsening or no clear explanation can be found for persistent leukopenia we will need to consider bone marrow biopsy --RTC in 6 months or sooner if abnormalities noted in the above labs.   No orders of the defined types were placed in this encounter.  All questions were answered. The patient knows to call the clinic with any problems, questions or concerns.  A total of more than 45 minutes were spent on this encounter and over half of that time was spent on counseling and coordination of care as outlined above.   Ledell Peoples, MD Department of Hematology/Oncology [facility] Phone: [phone] Pager: [phone] Email: [email or web address]  02/11/2020 8:33 AM

## 2020-02-12 LAB — HOMOCYSTEINE: Homocysteine: 9.1 umol/L (ref 0.0–14.5)

## 2020-02-14 ENCOUNTER — Telehealth: Payer: Self-pay | Admitting: *Deleted

## 2020-02-14 ENCOUNTER — Telehealth: Payer: Self-pay | Admitting: Hematology and Oncology

## 2020-02-14 LAB — METHYLMALONIC ACID, SERUM: Methylmalonic Acid, Quantitative: 80 nmol/L (ref 0–378)

## 2020-02-14 NOTE — Telephone Encounter (Signed)
Scheduled per 6/25 los. Unable to reach pt. Left voicemail with appt time and date.

## 2020-02-14 NOTE — Telephone Encounter (Signed)
Received vm message from patient requested review of lab results.  Not all results are in at this time.    Please advise

## 2020-02-15 LAB — COPPER, SERUM: Copper: 104 ug/dL (ref 80–158)

## 2020-02-17 ENCOUNTER — Other Ambulatory Visit: Payer: Self-pay | Admitting: *Deleted

## 2020-02-17 ENCOUNTER — Telehealth: Payer: Self-pay | Admitting: *Deleted

## 2020-02-17 MED ORDER — VITAMIN B-12 1000 MCG PO TABS
1000.0000 ug | ORAL_TABLET | Freq: Every day | ORAL | 5 refills | Status: DC
Start: 2020-02-17 — End: 2021-06-05

## 2020-02-17 NOTE — Telephone Encounter (Signed)
-----   Message from Jaci Standard, MD sent at 02/16/2020 11:45 AM EDT ----- Please let Mrs. Tschirhart know that her WBC count is stable, but her labs are concerning for low levels of vitamin b12. We can call Vitamin b12 PO daily into a pharmacy of her choosing. Our next follow up visit is scheduled with her in Dec 2021.  Azucena Freed  ----- Message ----- From: Leory Plowman, Lab In [facility] [place] [facility] Sent: 02/11/2020   9:30 AM EDT To: Jaci Standard, MD

## 2020-02-17 NOTE — Telephone Encounter (Signed)
TCT patient regarding lab results from 02/11/20.  Spoke with patient and advised that her WBC is stable at thsi time but her Vitamin B12 is low. She is agreeable to having a prescription for B12 called in to her pharmacy of [facility] on [address] in [place]. She is aware of her appt in December 2021  Prescription sent in to her pharmacy

## 2020-03-10 ENCOUNTER — Telehealth: Payer: Self-pay | Admitting: Hematology and Oncology

## 2020-03-10 NOTE — Telephone Encounter (Signed)
Rescheduled appointment per 7/12 provider message. Left message on patient voicemail with updated appointment date and time.

## 2020-04-15 ENCOUNTER — Other Ambulatory Visit: Payer: Self-pay

## 2020-04-15 ENCOUNTER — Ambulatory Visit
Admission: RE | Admit: 2020-04-15 | Discharge: 2020-04-15 | Disposition: A | Discharge status: Home / Self Care | Payer: 59 | Source: Ambulatory Visit | Attending: Emergency Medicine | Admitting: Emergency Medicine

## 2020-04-15 VITALS — BP 153/94 | HR 96 | Temp 98.3°F | Resp 18

## 2020-04-15 DIAGNOSIS — R05 Cough: Secondary | ICD-10-CM

## 2020-04-15 DIAGNOSIS — R059 Cough, unspecified: Secondary | ICD-10-CM

## 2020-04-15 DIAGNOSIS — R5383 Other fatigue: Secondary | ICD-10-CM

## 2020-04-15 NOTE — ED Triage Notes (Signed)
Pt here for fatigue x 1 week; pt sts was seen for same and had low WBC but everything "everything checked out OK" pt sts had her first covid vaccine on 04/02/2020; pt sts improved this am

## 2020-04-15 NOTE — ED Provider Notes (Addendum)
[facility]    CSN: [id number] Arrival date & time: 04/15/20  1018      History   Chief Complaint Chief Complaint  Patient presents with  . Appointment    1000  . Fatigue    HPI Sandra Robbins is a 44 y.o. female with history of hypertension, neutropenia (followed by hematology), vitamin D deficiency presenting for fatigue.  Underwent her first Covid vaccination 8/15.  States fatigue occurred this Monday.  No chest pain, palpitations, difficulty breathing or swallowing, lower leg swelling, fever.  Does endorse dry cough that began just the other day.  Has not taken thing for symptoms.  States that she did miss a few doses of her vitamin D: Began resuming the other day this with some improvement.  Denies easy bruising, bleeding, hematochezia, melena, hematuria, severe abdominal pain.    Past Medical History:  Diagnosis Date  . Headache    "monthly" (05/22/2018)  . Hypertension     Patient Active Problem List   Diagnosis Date Noted  . TIA (transient ischemic attack) 05/22/2018  . Hypertension 05/22/2018  . Hyperglycemia 05/22/2018    Past Surgical History:  Procedure Laterality Date  . TUBAL LIGATION  2000    OB History   No obstetric history on file.      Home Medications    Prior to Admission medications   Medication Sig Start Date End Date Taking? Authorizing Provider  acetaminophen (TYLENOL) 500 MG tablet Take 500 mg by mouth every 6 (six) hours as needed for pain.    [provider]  amLODipine (NORVASC) 10 MG tablet Take 10 mg by mouth daily.    [provider]  hydrochlorothiazide (HYDRODIURIL) 25 MG tablet Take 25 mg by mouth daily. 05/12/18   [provider]  metoprolol tartrate (LOPRESSOR) 25 MG tablet Take 25 mg by mouth 2 (two) times daily.    [provider]  vitamin B-12 (CYANOCOBALAMIN) 1000 MCG tablet Take 1 tablet (1,000 mcg total) by mouth daily. 02/17/20   Jaci Standard, MD    Family  History Family History  Problem Relation Age of Onset  . Hypertension Mother   . Pancreatic cancer Father 20  . Hypertension Sister   . Heart disease Sister   . Hypertension Sister   . Heart disease Sister   . Hypertension Sister     Social History Social History   Tobacco Use  . Smoking status: Never Smoker  . Smokeless tobacco: Never Used  Vaping Use  . Vaping Use: Never used  Substance Use Topics  . Alcohol use: Not Currently  . Drug use: Never     Allergies   Patient has no known allergies.   Review of Systems As per HPI   Physical Exam Triage Vital Signs ED Triage Vitals  Enc Vitals Group     BP      Pulse      Resp      Temp      Temp src      SpO2      Weight      Height      Head Circumference      Peak Flow      Pain Score      Pain Loc      Pain Edu?      Excl. in GC?    No data found.  Updated Vital Signs BP (!) 153/94 (BP Location: Left Arm)   Pulse 96   Temp  98.3 F (36.8 C) (Oral)   Resp 18   SpO2 96%   Visual Acuity Right Eye Distance:   Left Eye Distance:   Bilateral Distance:    Right Eye Near:   Left Eye Near:    Bilateral Near:     Physical Exam Constitutional:      General: She is not in acute distress. HENT:     Head: Normocephalic and atraumatic.  Eyes:     General: No scleral icterus.    Pupils: Pupils are equal, round, and reactive to light.  Cardiovascular:     Rate and Rhythm: Normal rate.  Pulmonary:     Effort: Pulmonary effort is normal.  Skin:    Coloration: Skin is not jaundiced or pale.  Neurological:     Mental Status: She is alert and oriented to person, place, and time.      UC Treatments / Results  Labs (all labs ordered are listed, but only abnormal results are displayed) Labs Reviewed  NOVEL CORONAVIRUS, NAA    EKG   Radiology No results found.  Procedures Procedures (including critical care time)  Medications Ordered in UC Medications - No data to display  Initial  Impression / Assessment and Plan / UC Course  I have reviewed the triage vital signs and the nursing notes.  Pertinent labs & imaging results that were available during my care of the patient were reviewed by me and considered in my medical decision making (see chart for details).     Patient febrile, nontoxic, hemodynamically stable.  Patient does have dry, mild cough that is new in conjunction with fatigue.  Agreeable to Covid testing.  Will defer to hematologist for further evaluation and management of neutropenia.  Low concern for active bleeding/anemia given history and reassuring exam.  Return precautions discussed, pt verbalized understanding and is agreeable to plan. Final Clinical Impressions(s) / UC Diagnoses   Final diagnoses:  Other fatigue  Cough     Discharge Instructions     Your COVID test is pending - it is important to quarantine / isolate at home until your results are back. If you test positive and would like further evaluation for persistent or worsening symptoms, you may schedule an E-visit or virtual (video) visit throughout the [facility] app or website.  PLEASE NOTE: If you develop severe chest pain or shortness of breath please go to the ER or call 9-1-1 for further evaluation --> DO NOT schedule electronic or virtual visits for this. Please call our office for further guidance / recommendations as needed.  For information about the Covid vaccine, please visit [email or web address]    ED Prescriptions    None     PDMP not reviewed this encounter.   Hall-Potvin, [place], PA-C 04/15/20 1050    Hall-Potvin, [place], [place] 04/15/20 1100

## 2020-04-15 NOTE — Discharge Instructions (Addendum)
Your COVID test is pending - it is important to quarantine / isolate at home until your results are back. °If you test positive and would like further evaluation for persistent or worsening symptoms, you may schedule an E-visit or virtual (video) visit throughout the [facility] MyChart app or website. ° °PLEASE NOTE: If you develop severe chest pain or shortness of breath please go to the ER or call 9-1-1 for further evaluation --> DO NOT schedule electronic or virtual visits for this. °Please call our office for further guidance / recommendations as needed. ° °For information about the Covid vaccine, please visit [email or web address] °

## 2020-04-16 LAB — NOVEL CORONAVIRUS, NAA: SARS-CoV-2, NAA: NOT DETECTED

## 2020-04-16 LAB — SARS-COV-2, NAA 2 DAY TAT

## 2020-07-31 ENCOUNTER — Ambulatory Visit (HOSPITAL_COMMUNITY)
Admission: EM | Admit: 2020-07-31 | Discharge: 2020-07-31 | Disposition: A | Discharge status: Home / Self Care | Payer: 59 | Attending: Internal Medicine | Admitting: Internal Medicine

## 2020-07-31 ENCOUNTER — Other Ambulatory Visit: Payer: Self-pay

## 2020-07-31 ENCOUNTER — Encounter (HOSPITAL_COMMUNITY): Payer: Self-pay | Admitting: *Deleted

## 2020-07-31 DIAGNOSIS — T7840XA Allergy, unspecified, initial encounter: Secondary | ICD-10-CM | POA: Diagnosis not present

## 2020-07-31 MED ORDER — METHYLPREDNISOLONE 4 MG PO TBPK: ORAL_TABLET | ORAL | 0 refills | Status: DC

## 2020-07-31 NOTE — ED Triage Notes (Signed)
PT ate a Mango and reports swelling to lips , rash around and skin is itching. No resp distress.

## 2020-07-31 NOTE — Discharge Instructions (Addendum)
Also take Claritin today and you may take Benadryl at night time if needed for itching.

## 2020-07-31 NOTE — ED Provider Notes (Signed)
[facility]    CSN: [id number] Arrival date & time: 07/31/20  0102     History   Chief Complaint Chief Complaint  Patient presents with  . Allergic Reaction    HPI Sandra Robbins is a 43 y.o. female who presents die to having swollen lips after she ate Mango 2 days ago and has rash which is itching. Denies trouble swallowing or breathing. She did not take any medications or used anything topically to help her except aveno lotion. She has had mango before and did not have this reaction.  The rash is spreading to her cheeks close to her lower orbit area  Past Medical History:  Diagnosis Date  . Headache    "monthly" (05/22/2018)  . Hypertension     Patient Active Problem List   Diagnosis Date Noted  . TIA (transient ischemic attack) 05/22/2018  . Hypertension 05/22/2018  . Hyperglycemia 05/22/2018    Past Surgical History:  Procedure Laterality Date  . TUBAL LIGATION  2000    OB History   No obstetric history on file.      Home Medications    Prior to Admission medications   Medication Sig Start Date End Date Taking? Authorizing Provider  acetaminophen (TYLENOL) 500 MG tablet Take 500 mg by mouth every 6 (six) hours as needed for pain.   Yes [provider]  amLODipine (NORVASC) 10 MG tablet Take 10 mg by mouth daily.   Yes [provider]  hydrochlorothiazide (HYDRODIURIL) 25 MG tablet Take 25 mg by mouth daily. 05/12/18  Yes [provider]  metoprolol tartrate (LOPRESSOR) 25 MG tablet Take 25 mg by mouth 2 (two) times daily.   Yes [provider]  vitamin B-12 (CYANOCOBALAMIN) 1000 MCG tablet Take 1 tablet (1,000 mcg total) by mouth daily. 02/17/20  Yes Jaci Standard, MD    Family History Family History  Problem Relation Age of Onset  . Hypertension Mother   . Pancreatic cancer Father 45  . Hypertension Sister   . Heart disease Sister   . Hypertension Sister   . Heart disease Sister   . Hypertension  Sister     Social History Social History   Tobacco Use  . Smoking status: Never Smoker  . Smokeless tobacco: Never Used  Vaping Use  . Vaping Use: Never used  Substance Use Topics  . Alcohol use: Not Currently  . Drug use: Never     Allergies   Other   Review of Systems Review of Systems  Constitutional: Negative for appetite change, fatigue and fever.  HENT: Negative for congestion, postnasal drip and trouble swallowing.   Eyes: Negative for discharge.  Respiratory: Negative for choking and shortness of breath.   Cardiovascular: Negative for chest pain.  Gastrointestinal: Negative for nausea and vomiting.  Musculoskeletal: Negative for gait problem.  Skin: Positive for rash.       Itching rash  Allergic/Immunologic: Negative for food allergies.  Hematological: Negative for adenopathy.   Physical Exam Triage Vital Signs ED Triage Vitals  Enc Vitals Group     BP 07/31/20 0926 (!) 162/94     Pulse Rate 07/31/20 0926 77     Resp 07/31/20 0926 18     Temp 07/31/20 0926 (!) 97.2 F (36.2 C)     Temp Source 07/31/20 0926 Oral     SpO2 07/31/20 0926 100 %     Weight 07/31/20 0927 200 lb (90.7 kg)     Height 07/31/20 0927 5'  7" (1.702 m)     Head Circumference --      Peak Flow --      Pain Score 07/31/20 0927 0     Pain Loc --      Pain Edu? --      Excl. in GC? --    No data found.  Updated Vital Signs BP (!) 162/94 (BP Location: Right Arm)   Pulse 77   Temp (!) 97.2 F (36.2 C) (Oral)   Resp 18   Ht 5\' 7"  (1.702 m)   Wt 200 lb (90.7 kg)   LMP 07/31/2020   SpO2 100%   BMI 31.32 kg/m   Visual Acuity Right Eye Distance:   Left Eye Distance:   Bilateral Distance:    Right Eye Near:   Left Eye Near:    Bilateral Near:     Physical Exam Vitals and nursing note reviewed.  Constitutional:      General: She is not in acute distress.    Appearance: She is obese. She is not toxic-appearing.  HENT:     Head: Normocephalic.     Right Ear: External  ear normal.     Mouth/Throat:     Mouth: Mucous membranes are moist.     Comments: Her lips look mildly swollen Eyes:     General: No scleral icterus.    Extraocular Movements: Extraocular movements intact.     Conjunctiva/sclera: Conjunctivae normal.  Pulmonary:     Effort: Pulmonary effort is normal.  Musculoskeletal:        General: Normal range of motion.     Cervical back: Neck supple.  Skin:    General: Skin is warm and dry.     Findings: Rash present.     Comments: Has a fine papular rash around her lip borders and spreads to her chin and face to below her orbits. It is not hot or red.   Neurological:     Mental Status: She is alert and oriented to person, place, and time.     Gait: Gait normal.  Psychiatric:        Mood and Affect: Mood normal.        Behavior: Behavior normal.        Thought Content: Thought content normal.        Judgment: Judgment normal.    UC Treatments / Results  Labs (all labs ordered are listed, but only abnormal results are displayed) Labs Reviewed - No data to display  EKG   Radiology No results found.  Procedures Procedures (including critical care time)  Medications Ordered in UC Medications - No data to display  Initial Impression / Assessment and Plan / UC Course  I have reviewed the triage vital signs and the nursing notes. Has local allergic reaction on lipds and face. I placed her on Medrol dose pack as noted. See instructions.  Final Clinical Impressions(s) / UC Diagnoses   Final diagnoses:  None   Discharge Instructions   None    ED Prescriptions    None     PDMP not reviewed this encounter.   08/02/2020, PA-C 07/31/20 1114

## 2020-08-14 ENCOUNTER — Other Ambulatory Visit: Payer: 59

## 2020-08-14 ENCOUNTER — Ambulatory Visit: Payer: 59 | Admitting: Hematology and Oncology

## 2020-08-20 ENCOUNTER — Other Ambulatory Visit: Payer: Self-pay | Admitting: Hematology and Oncology

## 2020-08-20 DIAGNOSIS — D708 Other neutropenia: Secondary | ICD-10-CM

## 2020-08-20 NOTE — Progress Notes (Signed)
No show

## 2020-08-21 ENCOUNTER — Inpatient Hospital Stay: Payer: 59

## 2020-08-21 ENCOUNTER — Telehealth: Payer: Self-pay | Admitting: Hematology and Oncology

## 2020-08-21 ENCOUNTER — Inpatient Hospital Stay: Payer: 59 | Admitting: Hematology and Oncology

## 2020-08-21 DIAGNOSIS — D708 Other neutropenia: Secondary | ICD-10-CM

## 2020-08-21 DIAGNOSIS — E538 Deficiency of other specified B group vitamins: Secondary | ICD-10-CM

## 2020-08-21 NOTE — Telephone Encounter (Signed)
Rescheduled appointment per 1/3 schedule message. Patient is aware of changes. 

## 2020-08-23 ENCOUNTER — Ambulatory Visit: Payer: 59 | Admitting: Hematology and Oncology

## 2020-08-23 ENCOUNTER — Other Ambulatory Visit: Payer: 59

## 2020-10-19 NOTE — Progress Notes (Signed)
[facility] [phone]   [phone]  PROGRESS NOTE  Patient Care Team: [facility] as PCP - General (Rheumatology)  Hematological/Oncological History # Leukopenia #Vitamin B12 deficiency 1) 01/03/2015: WBC 4.9, Hgb 12.8, MCV 90.1, Plt 256 2) 05/22/2018: WBC 7.4, Hgb 13.6, MCV 91.2, Plt 330 3) 01/06/2020: WBC 3.1, Hgb 12.3, Plt 248, MCV 89.8 4) 02/11/2020: establish care with Dr. Lorenso Courier   Interval History:  Sandra Robbins 45 y.o. female with medical history significant for leukopenia who presents for a follow up visit. The patient's last visit was on 02/11/2020. In the interim since the last visit she was found to be moderately deficient in Vitamin b12 and was started on PO therapy at that time.   On exam today Sandra Robbins notes has been doing well.  She had a boost in energy when she was taking her vitamin B12 medication, however unfortunately she stopped and was off medication for approximately 2 to 3 weeks.  She reports that she recently started retaking again about 2 to 3 days ago.  She notes that she was feeling sluggish prior to taking it and had a nice boost in energy.  Also in the interim she has had no infectious symptoms with no fevers, chills, sweats, nausea, vomiting or diarrhea.  She also reports that she is had no rashes or stomach upset.  A full 10 point ROS is listed below.  MEDICAL HISTORY:  Past Medical History:  Diagnosis Date  . Headache    "monthly" (05/22/2018)  . Hypertension     SURGICAL HISTORY: Past Surgical History:  Procedure Laterality Date  . TUBAL LIGATION  2000    SOCIAL HISTORY: Social History   Socioeconomic History  . Marital status: Single    Spouse name: Not on file  . Number of children: Not on file  . Years of education: Not on file  . Highest education level: Not on file  Occupational History  . Not on file  Tobacco Use  . Smoking status: Never Smoker  . Smokeless tobacco: Never  Used  Vaping Use  . Vaping Use: Never used  Substance and Sexual Activity  . Alcohol use: Not Currently  . Drug use: Never  . Sexual activity: Not Currently  Other Topics Concern  . Not on file  Social History Narrative  . Not on file   Social Determinants of Health   Financial Resource Strain: Not on file  Food Insecurity: Not on file  Transportation Needs: Not on file  Physical Activity: Not on file  Stress: Not on file  Social Connections: Not on file  Intimate Partner Violence: Not on file    FAMILY HISTORY: Family History  Problem Relation Age of Onset  . Hypertension Mother   . Pancreatic cancer Father 39  . Hypertension Sister   . Heart disease Sister   . Hypertension Sister   . Heart disease Sister   . Hypertension Sister     ALLERGIES:  is allergic to other.  MEDICATIONS:  Current Outpatient Medications  Medication Sig Dispense Refill  . acetaminophen (TYLENOL) 500 MG tablet Take 500 mg by mouth every 6 (six) hours as needed for pain.    Marland Kitchen amLODipine (NORVASC) 10 MG tablet Take 10 mg by mouth daily.    . hydrochlorothiazide (HYDRODIURIL) 25 MG tablet Take 25 mg by mouth daily.  0  . metoprolol tartrate (LOPRESSOR) 25 MG tablet Take 25 mg by mouth 2 (two) times daily.    . vitamin  B-12 (CYANOCOBALAMIN) 1000 MCG tablet Take 1 tablet (1,000 mcg total) by mouth daily. 30 tablet 5   No current facility-administered medications for this visit.    REVIEW OF SYSTEMS:   Constitutional: ( - ) fevers, ( - )  chills , ( - ) night sweats Eyes: ( - ) blurriness of vision, ( - ) double vision, ( - ) watery eyes Ears, nose, mouth, throat, and face: ( - ) mucositis, ( - ) sore throat Respiratory: ( - ) cough, ( - ) dyspnea, ( - ) wheezes Cardiovascular: ( - ) palpitation, ( - ) chest discomfort, ( - ) lower extremity swelling Gastrointestinal:  ( - ) nausea, ( - ) heartburn, ( - ) change in bowel habits Skin: ( - ) abnormal skin rashes Lymphatics: ( - ) new  lymphadenopathy, ( - ) easy bruising Neurological: ( - ) numbness, ( - ) tingling, ( - ) new weaknesses Behavioral/Psych: ( - ) mood change, ( - ) new changes  All other systems were reviewed with the patient and are negative.  PHYSICAL EXAMINATION:  Vitals:   10/20/20 0850  BP: (!) 175/100  Pulse: 90  Resp: 15  Temp: (!) 97.1 F (36.2 C)  SpO2: 99%   Filed Weights   10/20/20 0850  Weight: 195 lb 9.6 oz (88.7 kg)    GENERAL: well appearing middle aged [place] American female alert, no distress and comfortable SKIN: skin color, texture, turgor are normal, no rashes or significant lesions EYES: conjunctiva are pink and non-injected, sclera clear LUNGS: clear to auscultation and percussion with normal breathing effort HEART: regular rate & rhythm and no murmurs and no lower extremity edema Musculoskeletal: no cyanosis of digits and no clubbing  PSYCH: alert & oriented x 3, fluent speech NEURO: no focal motor/sensory deficits  LABORATORY DATA:  I have reviewed the data as listed CBC Latest Ref Rng & Units 10/20/2020 02/11/2020 05/22/2018  WBC 4.0 - 10.5 K/uL 3.4(L) 3.9(L) 7.4  Hemoglobin 12.0 - 15.0 g/dL 13.1 14.2 13.6  Hematocrit 36.0 - 46.0 % 37.9 42.2 41.6  Platelets 150 - 400 K/uL 264 257 330    CMP Latest Ref Rng & Units 10/20/2020 02/11/2020 05/23/2018  Glucose 70 - 99 mg/dL 107(H) 93 104(H)  BUN 6 - 20 mg/dL 8 10 8   Creatinine 0.60 - 1.20 mg/dL 0.78 0.80 0.69  Sodium 135 - 145 mmol/L 141 140 139  Potassium 3.5 - 5.1 mmol/L 3.2(L) 3.6 3.1(L)  Chloride 98 - 111 mmol/L 104 102 102  CO2 22 - 32 mmol/L 27 30 27   Calcium 8.9 - 10.3 mg/dL 9.3 10.0 9.5  Total Protein 6.5 - 8.1 g/dL 6.9 7.7 -  Total Bilirubin 0.2 - 1.6 mg/dL 0.5 0.4 -  Alkaline Phos 38 - 126 U/L 56 65 -  AST 11 - 38 U/L 13 15 -  ALT 10 - 47 U/L 13 20 -     RADIOGRAPHIC STUDIES: I have personally reviewed the radiological images as listed and agreed with the findings in the report. No results  found.  ASSESSMENT & PLAN Sandra Robbins 45 y.o. female with medical history significant for leukopenia who presents for a follow up visit.   After review the labs, the records, discussed with the patient the findings most consistent with a mild neutropenia/leukopenia.  The patient was also found to have vitamin B12 deficiency and is unclear if these 2 conditions are related.  The patient was taking vitamin B12, however unfortunately she stopped taking  this medication for a few weeks time.  She is restarting the medication now.  At this time would recommend that she restart the vitamin B12 therapy and then return to [place] in 3 months time in order to assure that her stores are replete.  # Leukopenia/Neutropenia, stable # Vitamin B12 deficiency  --today will order CBC, CMP, vitamin b12, folate --viral workup with Hep B and Hep C. HIV tested negative in 2019 --recent thyroid studies with PCP unremarkable --if WBC is worsening or no clear explanation can be found for persistent leukopenia we will need to consider bone marrow biopsy --continue PO vitamin b12 1036mg daily  --RTC in 3 month to monitor vitamin b12 levels.   No orders of the defined types were placed in this encounter.   All questions were answered. The patient knows to call the clinic with any problems, questions or concerns.  A total of more than 30 minutes were spent on this encounter and over half of that time was spent on counseling and coordination of care as outlined above.   JLedell Peoples MD Department of Hematology/Oncology [facility] CenterPhone: [phone]: [phone]: [email or web address]  10/22/2020 3:18 PM

## 2020-10-20 ENCOUNTER — Other Ambulatory Visit: Payer: Self-pay

## 2020-10-20 ENCOUNTER — Inpatient Hospital Stay: Payer: 59 | Attending: Hematology and Oncology

## 2020-10-20 ENCOUNTER — Inpatient Hospital Stay (HOSPITAL_BASED_OUTPATIENT_CLINIC_OR_DEPARTMENT_OTHER): Payer: 59 | Admitting: Hematology and Oncology

## 2020-10-20 VITALS — BP 175/100 | HR 90 | Temp 97.1°F | Resp 15 | Ht 67.0 in | Wt 195.6 lb

## 2020-10-20 DIAGNOSIS — I10 Essential (primary) hypertension: Secondary | ICD-10-CM | POA: Diagnosis not present

## 2020-10-20 DIAGNOSIS — E538 Deficiency of other specified B group vitamins: Secondary | ICD-10-CM | POA: Insufficient documentation

## 2020-10-20 DIAGNOSIS — D708 Other neutropenia: Secondary | ICD-10-CM

## 2020-10-20 DIAGNOSIS — D72819 Decreased white blood cell count, unspecified: Secondary | ICD-10-CM | POA: Insufficient documentation

## 2020-10-20 DIAGNOSIS — Z79899 Other long term (current) drug therapy: Secondary | ICD-10-CM | POA: Diagnosis not present

## 2020-10-20 DIAGNOSIS — Z800 Family history of malignant neoplasm of digestive organs: Secondary | ICD-10-CM | POA: Diagnosis not present

## 2020-10-20 LAB — CMP (CANCER CENTER ONLY)
ALT: 13 U/L (ref 10–47)
AST: 13 U/L (ref 11–38)
Albumin: 3.7 g/dL (ref 3.5–5.0)
Alkaline Phosphatase: 56 U/L (ref 38–126)
Anion gap: 10 (ref 5–15)
BUN: 8 mg/dL (ref 6–20)
CO2: 27 mmol/L (ref 22–32)
Calcium: 9.3 mg/dL (ref 8.9–10.3)
Chloride: 104 mmol/L (ref 98–111)
Creatinine: 0.78 mg/dL (ref 0.60–1.20)
GFR, Estimated: >60 mL/min (ref >60)
Glucose, Bld: 107 mg/dL — ABNORMAL HIGH (ref 70–99)
Potassium: 3.2 mmol/L — ABNORMAL LOW (ref 3.5–5.1)
Sodium: 141 mmol/L (ref 135–145)
Total Bilirubin: 0.5 mg/dL (ref 0.2–1.6)
Total Protein: 6.9 g/dL (ref 6.5–8.1)

## 2020-10-20 LAB — CBC WITH DIFFERENTIAL (CANCER CENTER ONLY)
Abs Immature Granulocytes: 0.01 10*3/uL (ref 0.00–0.07)
Basophils Absolute: 0 10*3/uL (ref 0.0–0.1)
Basophils Relative: 1 %
Eosinophils Absolute: 0.1 10*3/uL (ref 0.0–0.5)
Eosinophils Relative: 3 %
HCT: 37.9 % (ref 36.0–46.0)
Hemoglobin: 13.1 g/dL (ref 12.0–15.0)
Immature Granulocytes: 0 %
Lymphocytes Relative: 59 %
Lymphs Abs: 2 10*3/uL (ref 0.7–4.0)
MCH: 30.5 pg (ref 26.0–34.0)
MCHC: 34.6 g/dL (ref 30.0–36.0)
MCV: 88.3 fL (ref 80.0–100.0)
Monocytes Absolute: 0.4 10*3/uL (ref 0.1–1.0)
Monocytes Relative: 10 %
Neutro Abs: 0.9 10*3/uL — ABNORMAL LOW (ref 1.7–7.7)
Neutrophils Relative %: 27 %
Platelet Count: 264 10*3/uL (ref 150–400)
RBC: 4.29 MIL/uL (ref 3.87–5.11)
RDW: 12.2 % (ref 11.5–15.5)
WBC Count: 3.4 10*3/uL — ABNORMAL LOW (ref 4.0–10.5)
nRBC: 0 % (ref 0.0–0.2)

## 2020-10-20 LAB — VITAMIN B12: Vitamin B-12: 290 pg/mL (ref 180–914)

## 2020-10-20 LAB — FOLATE: Folate: 10.1 ng/mL (ref >5.9)

## 2020-10-23 ENCOUNTER — Telehealth: Payer: Self-pay | Admitting: Hematology and Oncology

## 2020-10-23 NOTE — Telephone Encounter (Signed)
Scheduled appointment per 03/06 schedule message. Contacted patient, patient is aware.

## 2020-12-05 ENCOUNTER — Encounter (HOSPITAL_COMMUNITY): Payer: Self-pay

## 2020-12-05 ENCOUNTER — Ambulatory Visit (HOSPITAL_COMMUNITY)
Admission: EM | Admit: 2020-12-05 | Discharge: 2020-12-05 | Disposition: A | Discharge status: Home / Self Care | Payer: 59

## 2020-12-05 ENCOUNTER — Ambulatory Visit (INDEPENDENT_AMBULATORY_CARE_PROVIDER_SITE_OTHER): Payer: 59

## 2020-12-05 DIAGNOSIS — M546 Pain in thoracic spine: Secondary | ICD-10-CM

## 2020-12-05 DIAGNOSIS — M545 Low back pain, unspecified: Secondary | ICD-10-CM

## 2020-12-05 DIAGNOSIS — M549 Dorsalgia, unspecified: Secondary | ICD-10-CM | POA: Diagnosis not present

## 2020-12-05 MED ORDER — NAPROXEN 500 MG PO TABS: 500.0000 mg | ORAL_TABLET | Freq: Two times a day (BID) | ORAL | 0 refills | Status: AC

## 2020-12-05 MED ORDER — CYCLOBENZAPRINE HCL 10 MG PO TABS: 10.0000 mg | ORAL_TABLET | Freq: Two times a day (BID) | ORAL | 0 refills | Status: AC | PRN

## 2020-12-05 NOTE — ED Triage Notes (Signed)
Pt reports excruciate pain in the middle back x 1 day. 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  Ibuprofen gives no relief.

## 2020-12-05 NOTE — ED Provider Notes (Signed)
[facility]    CSN: [id number] Arrival date & time: 12/05/20  1335      History   Chief Complaint Chief Complaint  Patient presents with  . Back Pain    HPI Sandra Robbins is a 45 y.o. female.   HPI  Back Pain: Patient reports that yesterday she was at work sitting. Her back started to ache a bit but she thought little of this. After lifting a few objects around 5 to 10 pounds when she started to have mid back pain that was rated as severe.  Worsening with time. Hurts to move back and tender to the lower midline spine. She has tried ibuprofen without relief. No fever, headache, neuro weakness, loss of sensation of groin, incontinence.    Past Medical History:  Diagnosis Date  . Headache    "monthly" (05/22/2018)  . Hypertension     Patient Active Problem List   Diagnosis Date Noted  . TIA (transient ischemic attack) 05/22/2018  . Hypertension 05/22/2018  . Hyperglycemia 05/22/2018    Past Surgical History:  Procedure Laterality Date  . TUBAL LIGATION  2000    OB History   No obstetric history on file.      Home Medications    Prior to Admission medications   Medication Sig Start Date End Date Taking? Authorizing Provider  cyclobenzaprine (FLEXERIL) 10 MG tablet Take 1 tablet (10 mg total) by mouth 2 (two) times daily as needed for muscle spasms. 12/05/20  Yes Terrick Allred, Maralyn Sago M, PA-C  ibuprofen (ADVIL) 200 MG tablet Take 200 mg by mouth every 6 (six) hours as needed.   Yes [provider]  naproxen (NAPROSYN) 500 MG tablet Take 1 tablet (500 mg total) by mouth 2 (two) times daily with a meal for 5 days. 12/05/20 12/10/20 Yes Pedram Goodchild M, PA-C  acetaminophen (TYLENOL) 500 MG tablet Take 500 mg by mouth every 6 (six) hours as needed for pain.    [provider]  amLODipine (NORVASC) 10 MG tablet Take 10 mg by mouth daily.    [provider]  hydrochlorothiazide (HYDRODIURIL) 25 MG tablet Take 25 mg by mouth daily.  05/12/18   [provider]  metoprolol tartrate (LOPRESSOR) 25 MG tablet Take 25 mg by mouth 2 (two) times daily.    [provider]  vitamin B-12 (CYANOCOBALAMIN) 1000 MCG tablet Take 1 tablet (1,000 mcg total) by mouth daily. 02/17/20   Jaci Standard, MD    Family History Family History  Problem Relation Age of Onset  . Hypertension Mother   . Pancreatic cancer Father 15  . Hypertension Sister   . Heart disease Sister   . Hypertension Sister   . Heart disease Sister   . Hypertension Sister     Social History Social History   Tobacco Use  . Smoking status: Never Smoker  . Smokeless tobacco: Never Used  Vaping Use  . Vaping Use: Never used  Substance Use Topics  . Alcohol use: Not Currently  . Drug use: Never     Allergies   Other   Review of Systems Review of Systems  As stated above in HPI Physical Exam Triage Vital Signs ED Triage Vitals  Enc Vitals Group     BP 12/05/20 1450 (!) 179/98     Pulse Rate 12/05/20 1450 75     Resp 12/05/20 1450 18     Temp 12/05/20 1450 98 F (36.7 C)     Temp Source 12/05/20 1450  Oral     SpO2 12/05/20 1450 98 %     Weight --      Height --      Head Circumference --      Peak Flow --      Pain Score 12/05/20 1448 10     Pain Loc --      Pain Edu? --      Excl. in GC? --    No data found.  Updated Vital Signs BP (!) 179/98 (BP Location: Right Arm)   Pulse 75   Temp 98 F (36.7 C) (Oral)   Resp 18   LMP  (Within Weeks) Comment: 3-4 weeks.   SpO2 98%   Physical Exam Vitals and nursing note reviewed.  Constitutional:      General: She is in acute distress (back pain when she moves).     Appearance: Normal appearance. She is not ill-appearing, toxic-appearing or diaphoretic.     Comments: Comfortably resting in wheelchair but does appear to be in pain with back ROM exercises from wheelchair  HENT:     Head: Normocephalic and atraumatic.  Eyes:     Extraocular Movements: Extraocular  movements intact.     Pupils: Pupils are equal, round, and reactive to light.  Musculoskeletal:        General: Tenderness (lower thoracic and upper lumbar spine) present. No swelling or deformity.     Cervical back: Normal range of motion and neck supple. No rigidity or tenderness.     Right lower leg: No edema.     Left lower leg: No edema.     Comments: Mild tenderness of the right mid back muscles  Neurological:     General: No focal deficit present.     Mental Status: She is alert and oriented to person, place, and time.     Motor: No weakness.     Deep Tendon Reflexes: Reflexes normal.      UC Treatments / Results  Labs (all labs ordered are listed, but only abnormal results are displayed) Labs Reviewed - No data to display  EKG   Radiology DG Thoracic Spine 2 View  Result Date: 12/05/2020 CLINICAL DATA:  Back pain after lifting boxes. EXAM: THORACIC SPINE 2 VIEWS COMPARISON:  None. FINDINGS: No evidence for thoracic spine fracture. No worrisome lytic or sclerotic osseous abnormality. Intervertebral disc spaces are preserved. No abnormal paraspinal line on the frontal projection. Mild convex leftward thoracic scoliosis evident. IMPRESSION: Negative. Electronically Signed   By: Kennith Center M.D.   On: 12/05/2020 15:40   DG Lumbar Spine Complete  Result Date: 12/05/2020 CLINICAL DATA:  Back pain after lifting boxes. EXAM: LUMBAR SPINE - COMPLETE 4+ VIEW COMPARISON:  None. FINDINGS: No fracture or subluxation. Mild loss of disc height noted L3-4. Facets are well aligned bilaterally. SI joints are unremarkable. IMPRESSION: No evidence for lumbar spine fracture. Electronically Signed   By: Kennith Center M.D.   On: 12/05/2020 15:40    Procedures Procedures (including critical care time)  Medications Ordered in UC Medications - No data to display  Initial Impression / Assessment and Plan / UC Course  I have reviewed the triage vital signs and the nursing notes.  Pertinent  labs & imaging results that were available during my care of the patient were reviewed by me and considered in my medical decision making (see chart for details).     New. X rays pending.  Update: X-rays are benign appearing which have discussed this with  patient.  Likely muscle spasms which we discussed.  Sending in naproxen and cyclobenzaprine for her to use.  We both agree that her blood pressure slight elevation from her normal today is likely secondary to pain.  This should improve with the medications although the naproxen can bump the blood pressure up slightly.  She will reduce her salt and increase her water intake.  We discussed close red flag signs and symptoms that would indicate that she needs to go to the emergency room for further follow-up including signs and symptoms of meningitis, other.  Final Clinical Impressions(s) / UC Diagnoses   Final diagnoses:  Mid back pain  Acute midline low back pain without sciatica   Discharge Instructions   None    ED Prescriptions    Medication Sig Dispense Auth. Provider   cyclobenzaprine (FLEXERIL) 10 MG tablet Take 1 tablet (10 mg total) by mouth 2 (two) times daily as needed for muscle spasms. 20 tablet Mekiah Wahler M, PA-C   naproxen (NAPROSYN) 500 MG tablet Take 1 tablet (500 mg total) by mouth 2 (two) times daily with a meal for 5 days. 10 tablet Rushie Chestnut, [place]     PDMP not reviewed this encounter.   Rushie Chestnut, [place] 12/05/20 1606

## 2021-01-24 ENCOUNTER — Other Ambulatory Visit: Payer: Self-pay | Admitting: Hematology and Oncology

## 2021-01-24 ENCOUNTER — Inpatient Hospital Stay: Payer: 59 | Attending: Hematology and Oncology

## 2021-01-24 ENCOUNTER — Inpatient Hospital Stay: Payer: 59 | Admitting: Hematology and Oncology

## 2021-01-24 DIAGNOSIS — D708 Other neutropenia: Secondary | ICD-10-CM

## 2021-01-24 NOTE — Progress Notes (Deleted)
[place] [phone]   Fax:([phone]  PROGRESS NOTE  Patient Care Team: [facility] as PCP - General (Rheumatology)  Hematological/Oncological History # Leukopenia #Vitamin B12 deficiency 1) 01/03/2015: WBC 4.9, Hgb 12.8, MCV 90.1, Plt 256 2) 05/22/2018: WBC 7.4, Hgb 13.6, MCV 91.2, Plt 330 3) 01/06/2020: WBC 3.1, Hgb 12.3, Plt 248, MCV 89.8 4) 02/11/2020: establish care with Dr. Lorenso Courier   Interval History:  Sandra Robbins 45 y.o. female with medical history significant for leukopenia who presents for a follow up visit. The patient's last visit was on 02/11/2020. In the interim since the last visit she was found to be moderately deficient in Vitamin b12 and was started on PO therapy at that time.   On exam today Sandra Robbins notes has been doing well.  She had a boost in energy when she was taking her vitamin B12 medication, however unfortunately she stopped and was off medication for approximately 2 to 3 weeks.  She reports that she recently started retaking again about 2 to 3 days ago.  She notes that she was feeling sluggish prior to taking it and had a nice boost in energy.  Also in the interim she has had no infectious symptoms with no fevers, chills, sweats, nausea, vomiting or diarrhea.  She also reports that she is had no rashes or stomach upset.  A full 10 point ROS is listed below.  MEDICAL HISTORY:  Past Medical History:  Diagnosis Date  . Headache    "monthly" (05/22/2018)  . Hypertension     SURGICAL HISTORY: Past Surgical History:  Procedure Laterality Date  . TUBAL LIGATION  2000    SOCIAL HISTORY: Social History   Socioeconomic History  . Marital status: Single    Spouse name: Not on file  . Number of children: Not on file  . Years of education: Not on file  . Highest education level: Not on file  Occupational History  . Not on file  Tobacco Use  . Smoking status: Never Smoker  . Smokeless tobacco: Never  Used  Vaping Use  . Vaping Use: Never used  Substance and Sexual Activity  . Alcohol use: Not Currently  . Drug use: Never  . Sexual activity: Not Currently  Other Topics Concern  . Not on file  Social History Narrative  . Not on file   Social Determinants of Health   Financial Resource Strain: Not on file  Food Insecurity: Not on file  Transportation Needs: Not on file  Physical Activity: Not on file  Stress: Not on file  Social Connections: Not on file  Intimate Partner Violence: Not on file    FAMILY HISTORY: Family History  Problem Relation Age of Onset  . Hypertension Mother   . Pancreatic cancer Father 45  . Hypertension Sister   . Heart disease Sister   . Hypertension Sister   . Heart disease Sister   . Hypertension Sister     ALLERGIES:  is allergic to other.  MEDICATIONS:  Current Outpatient Medications  Medication Sig Dispense Refill  . acetaminophen (TYLENOL) 500 MG tablet Take 500 mg by mouth every 6 (six) hours as needed for pain.    Marland Kitchen amLODipine (NORVASC) 10 MG tablet Take 10 mg by mouth daily.    . cyclobenzaprine (FLEXERIL) 10 MG tablet Take 1 tablet (10 mg total) by mouth 2 (two) times daily as needed for muscle spasms. 20 tablet 0  . hydrochlorothiazide (HYDRODIURIL) 25 MG tablet Take 25 mg  by mouth daily.  0  . ibuprofen (ADVIL) 200 MG tablet Take 200 mg by mouth every 6 (six) hours as needed.    . metoprolol tartrate (LOPRESSOR) 25 MG tablet Take 25 mg by mouth 2 (two) times daily.    . vitamin B-12 (CYANOCOBALAMIN) 1000 MCG tablet Take 1 tablet (1,000 mcg total) by mouth daily. 30 tablet 5   No current facility-administered medications for this visit.    REVIEW OF SYSTEMS:   Constitutional: ( - ) fevers, ( - )  chills , ( - ) night sweats Eyes: ( - ) blurriness of vision, ( - ) double vision, ( - ) watery eyes Ears, nose, mouth, throat, and face: ( - ) mucositis, ( - ) sore throat Respiratory: ( - ) cough, ( - ) dyspnea, ( - )  wheezes Cardiovascular: ( - ) palpitation, ( - ) chest discomfort, ( - ) lower extremity swelling Gastrointestinal:  ( - ) nausea, ( - ) heartburn, ( - ) change in bowel habits Skin: ( - ) abnormal skin rashes Lymphatics: ( - ) new lymphadenopathy, ( - ) easy bruising Neurological: ( - ) numbness, ( - ) tingling, ( - ) new weaknesses Behavioral/Psych: ( - ) mood change, ( - ) new changes  All other systems were reviewed with the patient and are negative.  PHYSICAL EXAMINATION:  There were no vitals filed for this visit. There were no vitals filed for this visit.  GENERAL: well appearing middle aged [place] American female alert, no distress and comfortable SKIN: skin color, texture, turgor are normal, no rashes or significant lesions EYES: conjunctiva are pink and non-injected, sclera clear LUNGS: clear to auscultation and percussion with normal breathing effort HEART: regular rate & rhythm and no murmurs and no lower extremity edema Musculoskeletal: no cyanosis of digits and no clubbing  PSYCH: alert & oriented x 3, fluent speech NEURO: no focal motor/sensory deficits  LABORATORY DATA:  I have reviewed the data as listed CBC Latest Ref Rng & Units 10/20/2020 02/11/2020 05/22/2018  WBC 4.0 - 10.5 K/uL 3.4(L) 3.9(L) 7.4  Hemoglobin 12.0 - 15.0 g/dL 13.1 14.2 13.6  Hematocrit 36.0 - 46.0 % 37.9 42.2 41.6  Platelets 150 - 400 K/uL 264 257 330    CMP Latest Ref Rng & Units 10/20/2020 02/11/2020 05/23/2018  Glucose 70 - 99 mg/dL 107(H) 93 104(H)  BUN 6 - 20 mg/dL 8 10 8   Creatinine 0.60 - 1.20 mg/dL 0.78 0.80 0.69  Sodium 135 - 145 mmol/L 141 140 139  Potassium 3.5 - 5.1 mmol/L 3.2(L) 3.6 3.1(L)  Chloride 98 - 111 mmol/L 104 102 102  CO2 22 - 32 mmol/L 27 30 27   Calcium 8.9 - 10.3 mg/dL 9.3 10.0 9.5  Total Protein 6.5 - 8.1 g/dL 6.9 7.7 -  Total Bilirubin 0.2 - 1.6 mg/dL 0.5 0.4 -  Alkaline Phos 38 - 126 U/L 56 65 -  AST 11 - 38 U/L 13 15 -  ALT 10 - 47 U/L 13 20 -      RADIOGRAPHIC STUDIES: I have personally reviewed the radiological images as listed and agreed with the findings in the report. No results found.  ASSESSMENT & PLAN Sandra Robbins 45 y.o. female with medical history significant for leukopenia who presents for a follow up visit.   After review the labs, the records, discussed with the patient the findings most consistent with a mild neutropenia/leukopenia.  The patient was also found to have vitamin B12 deficiency and  is unclear if these 2 conditions are related.  The patient was taking vitamin B12, however unfortunately she stopped taking this medication for a few weeks time.  She is restarting the medication now.  At this time would recommend that she restart the vitamin B12 therapy and then return to [place] in 3 months time in order to assure that her stores are replete.  # Leukopenia/Neutropenia, stable # Vitamin B12 deficiency  --today will order CBC, CMP, vitamin b12, folate --viral workup with Hep B and Hep C. HIV tested negative in 2019 --recent thyroid studies with PCP unremarkable --if WBC is worsening or no clear explanation can be found for persistent leukopenia we will need to consider bone marrow biopsy --continue PO vitamin b12 1040mg daily  --RTC in 3 month to monitor vitamin b12 levels.   No orders of the defined types were placed in this encounter.   All questions were answered. The patient knows to call the clinic with any problems, questions or concerns.  A total of more than 30 minutes were spent on this encounter and over half of that time was spent on counseling and coordination of care as outlined above.   JLedell Peoples MD Department of Hematology/Oncology [facility]at [facility] HospitalPhone: [phone]: [phone]: [email or web address]  01/24/2021 7:47 AM

## 2021-04-20 ENCOUNTER — Telehealth: Payer: Self-pay | Admitting: Hematology and Oncology

## 2021-04-20 NOTE — Telephone Encounter (Signed)
Scheduled follow-up appointment per 9/2 staff message. Patient is aware.

## 2021-04-24 ENCOUNTER — Telehealth: Payer: Self-pay

## 2021-04-24 ENCOUNTER — Ambulatory Visit: Payer: 59 | Admitting: Hematology and Oncology

## 2021-04-24 ENCOUNTER — Other Ambulatory Visit: Payer: 59

## 2021-04-24 NOTE — Telephone Encounter (Signed)
I have attempted without success to contact this patient by phone regarding their appointment today. No answer. Dr. Leonides Schanz made aware.

## 2021-05-30 ENCOUNTER — Inpatient Hospital Stay (HOSPITAL_BASED_OUTPATIENT_CLINIC_OR_DEPARTMENT_OTHER): Payer: 59 | Admitting: Hematology and Oncology

## 2021-05-30 ENCOUNTER — Other Ambulatory Visit: Payer: Self-pay

## 2021-05-30 ENCOUNTER — Inpatient Hospital Stay: Payer: 59 | Attending: Hematology and Oncology

## 2021-05-30 VITALS — BP 172/97 | HR 75 | Temp 98.3°F | Resp 17 | Wt 201.6 lb

## 2021-05-30 DIAGNOSIS — D72819 Decreased white blood cell count, unspecified: Secondary | ICD-10-CM | POA: Diagnosis not present

## 2021-05-30 DIAGNOSIS — Z79899 Other long term (current) drug therapy: Secondary | ICD-10-CM | POA: Insufficient documentation

## 2021-05-30 DIAGNOSIS — E538 Deficiency of other specified B group vitamins: Secondary | ICD-10-CM | POA: Insufficient documentation

## 2021-05-30 DIAGNOSIS — Z800 Family history of malignant neoplasm of digestive organs: Secondary | ICD-10-CM | POA: Insufficient documentation

## 2021-05-30 DIAGNOSIS — D708 Other neutropenia: Secondary | ICD-10-CM

## 2021-05-30 DIAGNOSIS — I10 Essential (primary) hypertension: Secondary | ICD-10-CM

## 2021-05-30 LAB — CMP (CANCER CENTER ONLY)
ALT: 19 U/L (ref 0–44)
AST: 19 U/L (ref 15–41)
Albumin: 3.9 g/dL (ref 3.5–5.0)
Alkaline Phosphatase: 53 U/L (ref 38–126)
Anion gap: 7 (ref 5–15)
BUN: 10 mg/dL (ref 6–20)
CO2: 29 mmol/L (ref 22–32)
Calcium: 9.9 mg/dL (ref 8.9–10.3)
Chloride: 106 mmol/L (ref 98–111)
Creatinine: 0.75 mg/dL (ref 0.44–1.00)
GFR, Estimated: >60 mL/min (ref >60)
Glucose, Bld: 92 mg/dL (ref 70–99)
Potassium: 3.4 mmol/L — ABNORMAL LOW (ref 3.5–5.1)
Sodium: 142 mmol/L (ref 135–145)
Total Bilirubin: 0.5 mg/dL (ref 0.3–1.2)
Total Protein: 7.2 g/dL (ref 6.5–8.1)

## 2021-05-30 LAB — CBC WITH DIFFERENTIAL (CANCER CENTER ONLY)
Abs Immature Granulocytes: 0.01 10*3/uL (ref 0.00–0.07)
Basophils Absolute: 0.1 10*3/uL (ref 0.0–0.1)
Basophils Relative: 1 %
Eosinophils Absolute: 0.1 10*3/uL (ref 0.0–0.5)
Eosinophils Relative: 3 %
HCT: 38.9 % (ref 36.0–46.0)
Hemoglobin: 13 g/dL (ref 12.0–15.0)
Immature Granulocytes: 0 %
Lymphocytes Relative: 57 %
Lymphs Abs: 2.2 10*3/uL (ref 0.7–4.0)
MCH: 29.5 pg (ref 26.0–34.0)
MCHC: 33.4 g/dL (ref 30.0–36.0)
MCV: 88.4 fL (ref 80.0–100.0)
Monocytes Absolute: 0.4 10*3/uL (ref 0.1–1.0)
Monocytes Relative: 11 %
Neutro Abs: 1.1 10*3/uL — ABNORMAL LOW (ref 1.7–7.7)
Neutrophils Relative %: 28 %
Platelet Count: 251 10*3/uL (ref 150–400)
RBC: 4.4 MIL/uL (ref 3.87–5.11)
RDW: 12.1 % (ref 11.5–15.5)
WBC Count: 3.8 10*3/uL — ABNORMAL LOW (ref 4.0–10.5)
nRBC: 0 % (ref 0.0–0.2)

## 2021-05-30 LAB — RETIC PANEL
Immature Retic Fract: 7 % (ref 2.3–15.9)
RBC.: 4.36 MIL/uL (ref 3.87–5.11)
Retic Count, Absolute: 55.4 10*3/uL (ref 19.0–186.0)
Retic Ct Pct: 1.3 % (ref 0.4–3.1)
Reticulocyte Hemoglobin: 33.9 pg (ref >27.9)

## 2021-05-30 LAB — VITAMIN B12: Vitamin B-12: 320 pg/mL (ref 180–914)

## 2021-05-30 NOTE — Progress Notes (Signed)
[place] [phone]   [phone]  PROGRESS NOTE  Patient Care Team: [facility], [place] as PCP - General (Rheumatology)  Hematological/Oncological History # Leukopenia #Vitamin B12 deficiency 1) 01/03/2015: WBC 4.9, Hgb 12.8, MCV 90.1, Plt 256 2) 05/22/2018: WBC 7.4, Hgb 13.6, MCV 91.2, Plt 330 3) 01/06/2020: WBC 3.1, Hgb 12.3, Plt 248, MCV 89.8 4) 02/11/2020: establish care with Dr. Lorenso Courier  5) 10/20/2020: WBC 3.4, Hgb 13.1, MCV 88.3, Plt 264 6) 05/30/2021: WBC 3.8, Hgb 13.0, MCV 88.4, Plt 251  Interval History:  Sandra Robbins 45 y.o. female with medical history significant for leukopenia who presents for a follow up visit. The patient's last visit was on 02/11/2020. In the interim since the last visit she was found to be moderately deficient in Vitamin b12 and was started on PO therapy at that time.   On exam today [name] reports that she has been doing well in the interim since her last visit.  She notes that she has continued to take her vitamin B12 pills but currently needs a refill.  She denies any issues with infections or COVID infection in interim since her last visit.  She notes that her energy levels are quite good.  She also reports that she is not currently eating much in the way of red meat.  Also in the interim she has had no infectious symptoms with no fevers, chills, sweats, nausea, vomiting or diarrhea.  She also reports that she is had no rashes or stomach upset.  A full 10 point ROS is listed below.  MEDICAL HISTORY:  Past Medical History:  Diagnosis Date   Headache    "monthly" (05/22/2018)   Hypertension     SURGICAL HISTORY: Past Surgical History:  Procedure Laterality Date   TUBAL LIGATION  2000    SOCIAL HISTORY: Social History   Socioeconomic History   Marital status: Single    Spouse name: Not on file   Number of children: Not on file   Years of education: Not on file   Highest education level:  Not on file  Occupational History   Not on file  Tobacco Use   Smoking status: Never   Smokeless tobacco: Never  Vaping Use   Vaping Use: Never used  Substance and Sexual Activity   Alcohol use: Not Currently   Drug use: Never   Sexual activity: Not Currently  Other Topics Concern   Not on file  Social History Narrative   Not on file   Social Determinants of Health   Financial Resource Strain: Not on file  Food Insecurity: Not on file  Transportation Needs: Not on file  Physical Activity: Not on file  Stress: Not on file  Social Connections: Not on file  Intimate Partner Violence: Not on file    FAMILY HISTORY: Family History  Problem Relation Age of Onset   Hypertension Mother    Pancreatic cancer Father 12   Hypertension Sister    Heart disease Sister    Hypertension Sister    Heart disease Sister    Hypertension Sister     ALLERGIES:  is allergic to other.  MEDICATIONS:  Current Outpatient Medications  Medication Sig Dispense Refill   acetaminophen (TYLENOL) 500 MG tablet Take 500 mg by mouth every 6 (six) hours as needed for pain.     amLODipine (NORVASC) 10 MG tablet Take 10 mg by mouth daily.     cyclobenzaprine (FLEXERIL) 10 MG tablet Take 1 tablet (10 mg  total) by mouth 2 (two) times daily as needed for muscle spasms. 20 tablet 0   hydrochlorothiazide (HYDRODIURIL) 25 MG tablet Take 25 mg by mouth daily.  0   ibuprofen (ADVIL) 200 MG tablet Take 200 mg by mouth every 6 (six) hours as needed.     metoprolol tartrate (LOPRESSOR) 25 MG tablet Take 25 mg by mouth 2 (two) times daily.     vitamin B-12 (CYANOCOBALAMIN) 1000 MCG tablet Take 1 tablet (1,000 mcg total) by mouth daily. 30 tablet 5   No current facility-administered medications for this visit.    REVIEW OF SYSTEMS:   Constitutional: ( - ) fevers, ( - )  chills , ( - ) night sweats Eyes: ( - ) blurriness of vision, ( - ) double vision, ( - ) watery eyes Ears, nose, mouth, throat, and face: ( - )  mucositis, ( - ) sore throat Respiratory: ( - ) cough, ( - ) dyspnea, ( - ) wheezes Cardiovascular: ( - ) palpitation, ( - ) chest discomfort, ( - ) lower extremity swelling Gastrointestinal:  ( - ) nausea, ( - ) heartburn, ( - ) change in bowel habits Skin: ( - ) abnormal skin rashes Lymphatics: ( - ) new lymphadenopathy, ( - ) easy bruising Neurological: ( - ) numbness, ( - ) tingling, ( - ) new weaknesses Behavioral/Psych: ( - ) mood change, ( - ) new changes  All other systems were reviewed with the patient and are negative.  PHYSICAL EXAMINATION:  Vitals:   05/30/21 0840  BP: (!) 172/97  Pulse: 75  Resp: 17  Temp: 98.3 F (36.8 C)  SpO2: 99%    Filed Weights   05/30/21 0840  Weight: 201 lb 9.6 oz (91.4 kg)     GENERAL: well appearing middle aged [place] American female alert, no distress and comfortable SKIN: skin color, texture, turgor are normal, no rashes or significant lesions EYES: conjunctiva are pink and non-injected, sclera clear LUNGS: clear to auscultation and percussion with normal breathing effort HEART: regular rate & rhythm and no murmurs and no lower extremity edema Musculoskeletal: no cyanosis of digits and no clubbing  PSYCH: alert & oriented x 3, fluent speech NEURO: no focal motor/sensory deficits  LABORATORY DATA:  I have reviewed the data as listed CBC Latest Ref Rng & Units 05/30/2021 10/20/2020 02/11/2020  WBC 4.0 - 10.5 K/uL 3.8(L) 3.4(L) 3.9(L)  Hemoglobin 12.0 - 15.0 g/dL 13.0 13.1 14.2  Hematocrit 36.0 - 46.0 % 38.9 37.9 42.2  Platelets 150 - 400 K/uL 251 264 257    CMP Latest Ref Rng & Units 05/30/2021 10/20/2020 02/11/2020  Glucose 70 - 99 mg/dL 92 107(H) 93  BUN 6 - 20 mg/dL _0 Creatinine 0.44 - 1.00 mg/dL 0.75 0.78 0.80  Sodium 135 - 145 mmol/L 142 141 140  Potassium 3.5 - 5.1 mmol/L 3.4(L) 3.2(L) 3.6  Chloride 98 - 111 mmol/L 106 104 102  CO2 22 - 32 mmol/L _1 Calcium 8.9 - 10.3 mg/dL 9.9 9.3 10.0  Total Protein 6.5 -  8.1 g/dL 7.2 6.9 7.7  Total Bilirubin 0.3 - 1.2 mg/dL 0.5 0.5 0.4  Alkaline Phos 38 - 126 U/L 53 56 65  AST 15 - 41 U/L _2 ALT 0 - 44 U/L _3 RADIOGRAPHIC STUDIES: I have personally reviewed the radiological images as listed and agreed with the findings in the report. No results found.  ASSESSMENT &  PLAN RAYNIE STEINHAUS 45 y.o. female with medical history significant for leukopenia who presents for a follow up visit.   After review the labs, the records, discussed with the patient the findings most consistent with a mild neutropenia/leukopenia.  The patient was also found to have vitamin B12 deficiency and is unclear if these 2 conditions are related.  The patient was taking vitamin B12, however unfortunately she stopped taking this medication for a few weeks time.  She is restarting the medication now.  At this time would recommend that she restart the vitamin B12 therapy and then return to [place] in 3 months time in order to assure that her stores are replete.  # Leukopenia/Neutropenia, stable # Vitamin B12 deficiency  --today will order CBC, CMP, vitamin b12, folate --viral workup with Hep B and Hep C. HIV tested negative in 2019 --recent thyroid studies with PCP unremarkable --if WBC is worsening or no clear explanation can be found for persistent leukopenia we will need to consider bone marrow biopsy --continue PO vitamin b12 1065mg daily  --today WBC 3.8, ANC 1.1.  --RTC in 6 month to monitor vitamin b12 levels.   No orders of the defined types were placed in this encounter.   All questions were answered. The patient knows to call the clinic with any problems, questions or concerns.  A total of more than 30 minutes were spent on this encounter and over half of that time was spent on counseling and coordination of care as outlined above.   JLedell Peoples MD Department of Hematology/Oncology [facility]at [facility] CenterPhone:  [phone]: [phone]: [email or web address]  06/05/2021 1:54 PM

## 2021-06-01 LAB — METHYLMALONIC ACID, SERUM: Methylmalonic Acid, Quantitative: 99 nmol/L (ref 0–378)

## 2021-06-05 ENCOUNTER — Telehealth: Payer: Self-pay | Admitting: Hematology and Oncology

## 2021-06-05 MED ORDER — VITAMIN B-12 1000 MCG PO TABS: 1000.0000 ug | ORAL_TABLET | Freq: Every day | ORAL | 3 refills | Status: AC

## 2021-06-05 NOTE — Telephone Encounter (Signed)
Scheduled per sch msg. Called an not able to leave msg. Mailed printout

## 2021-09-06 LAB — COLOGUARD

## 2021-09-06 LAB — EXTERNAL GENERIC LAB PROCEDURE

## 2021-12-03 ENCOUNTER — Other Ambulatory Visit: Payer: Self-pay | Admitting: Hematology and Oncology

## 2021-12-03 ENCOUNTER — Inpatient Hospital Stay: Payer: 59 | Attending: Hematology and Oncology | Admitting: Hematology and Oncology

## 2021-12-03 ENCOUNTER — Inpatient Hospital Stay: Payer: 59

## 2021-12-03 ENCOUNTER — Other Ambulatory Visit: Payer: Self-pay

## 2021-12-03 VITALS — BP 147/91 | HR 72 | Temp 97.5°F | Resp 16 | Wt 194.7 lb

## 2021-12-03 DIAGNOSIS — Z79899 Other long term (current) drug therapy: Secondary | ICD-10-CM | POA: Insufficient documentation

## 2021-12-03 DIAGNOSIS — D708 Other neutropenia: Secondary | ICD-10-CM

## 2021-12-03 DIAGNOSIS — D72819 Decreased white blood cell count, unspecified: Secondary | ICD-10-CM | POA: Insufficient documentation

## 2021-12-03 DIAGNOSIS — I10 Essential (primary) hypertension: Secondary | ICD-10-CM | POA: Diagnosis not present

## 2021-12-03 DIAGNOSIS — Z800 Family history of malignant neoplasm of digestive organs: Secondary | ICD-10-CM | POA: Insufficient documentation

## 2021-12-03 DIAGNOSIS — E538 Deficiency of other specified B group vitamins: Secondary | ICD-10-CM | POA: Diagnosis present

## 2021-12-03 LAB — VITAMIN B12: Vitamin B-12: 470 pg/mL (ref 180–914)

## 2021-12-03 LAB — CMP (CANCER CENTER ONLY)
ALT: 14 U/L (ref 0–44)
AST: 12 U/L — ABNORMAL LOW (ref 15–41)
Albumin: 4.1 g/dL (ref 3.5–5.0)
Alkaline Phosphatase: 65 U/L (ref 38–126)
Anion gap: 4 — ABNORMAL LOW (ref 5–15)
BUN: 14 mg/dL (ref 6–20)
CO2: 33 mmol/L — ABNORMAL HIGH (ref 22–32)
Calcium: 9.9 mg/dL (ref 8.9–10.3)
Chloride: 101 mmol/L (ref 98–111)
Creatinine: 0.95 mg/dL (ref 0.44–1.00)
GFR, Estimated: >60 mL/min (ref >60)
Glucose, Bld: 95 mg/dL (ref 70–99)
Potassium: 3.8 mmol/L (ref 3.5–5.1)
Sodium: 138 mmol/L (ref 135–145)
Total Bilirubin: 0.3 mg/dL (ref 0.3–1.2)
Total Protein: 7.2 g/dL (ref 6.5–8.1)

## 2021-12-03 LAB — CBC WITH DIFFERENTIAL (CANCER CENTER ONLY)
Abs Immature Granulocytes: 0 10*3/uL (ref 0.00–0.07)
Basophils Absolute: 0 10*3/uL (ref 0.0–0.1)
Basophils Relative: 1 %
Eosinophils Absolute: 0.1 10*3/uL (ref 0.0–0.5)
Eosinophils Relative: 3 %
HCT: 37.9 % (ref 36.0–46.0)
Hemoglobin: 12.9 g/dL (ref 12.0–15.0)
Immature Granulocytes: 0 %
Lymphocytes Relative: 59 %
Lymphs Abs: 2.1 10*3/uL (ref 0.7–4.0)
MCH: 30.3 pg (ref 26.0–34.0)
MCHC: 34 g/dL (ref 30.0–36.0)
MCV: 89 fL (ref 80.0–100.0)
Monocytes Absolute: 0.3 10*3/uL (ref 0.1–1.0)
Monocytes Relative: 9 %
Neutro Abs: 1 10*3/uL — ABNORMAL LOW (ref 1.7–7.7)
Neutrophils Relative %: 28 %
Platelet Count: 277 10*3/uL (ref 150–400)
RBC: 4.26 MIL/uL (ref 3.87–5.11)
RDW: 12.6 % (ref 11.5–15.5)
WBC Count: 3.5 10*3/uL — ABNORMAL LOW (ref 4.0–10.5)
nRBC: 0 % (ref 0.0–0.2)

## 2021-12-03 NOTE — Progress Notes (Signed)
?[place] ?[phone]   [phone] ? ?PROGRESS NOTE ? ?Patient Care Team: ?[facility] as PCP - General (Rheumatology) ? ?Hematological/Oncological History ?# Leukopenia ?#Vitamin B12 deficiency ?1) 01/03/2015: WBC 4.9, Hgb 12.8, MCV 90.1, Plt 256 ?2) 05/22/2018: WBC 7.4, Hgb 13.6, MCV 91.2, Plt 330 ?3) 01/06/2020: WBC 3.1, Hgb 12.3, Plt 248, MCV 89.8 ?4) 02/11/2020: establish care with Dr. Lorenso Courier  ?5) 10/20/2020: WBC 3.4, Hgb 13.1, MCV 88.3, Plt 264 ?6) 05/30/2021: WBC 3.8, Hgb 13.0, MCV 88.4, Plt 251 ?7) 12/03/2021: WBC 3.5, Hgb 12.9, MCV 89, Plt 277 ? ?Interval History:  ?Sandra Robbins 46 y.o. female with medical history significant for leukopenia who presents for a follow up visit. The patient's last visit was on 05/30/2021. In the interim since the last visit she continued Vitamin b12 PO therapy.  ? ?On exam today [name] reports she has intentionally lost 7 pounds in the interim since her last visit by decreasing sweets in her diet.  She notes that she is trying to eat better foods.  She notes that her energy levels are "very good".  She continues to take vitamin B12 pills without any difficulty.  She notes that she has not had any infections in the interim since her last visit.  She denies any pneumonia, urinary tract infections, or sinus infection.  She also has not had COVID or the flu.  She notes that otherwise she has been at her baseline level of health.  Also in the interim she has had no infectious symptoms with no fevers, chills, sweats, nausea, vomiting or diarrhea.  She also reports that she is had no rashes or stomach upset.  A full 10 point ROS is listed below. ? ?MEDICAL HISTORY:  ?Past Medical History:  ?Diagnosis Date  ? Headache   ? "monthly" (05/22/2018)  ? Hypertension   ? ? ?SURGICAL HISTORY: ?Past Surgical History:  ?Procedure Laterality Date  ? TUBAL LIGATION  2000  ? ? ?SOCIAL HISTORY: ?Social History  ? ?Socioeconomic History  ?  Marital status: Single  ?  Spouse name: Not on file  ? Number of children: Not on file  ? Years of education: Not on file  ? Highest education level: Not on file  ?Occupational History  ? Not on file  ?Tobacco Use  ? Smoking status: Never  ? Smokeless tobacco: Never  ?Vaping Use  ? Vaping Use: Never used  ?Substance and Sexual Activity  ? Alcohol use: Not Currently  ? Drug use: Never  ? Sexual activity: Not Currently  ?Other Topics Concern  ? Not on file  ?Social History Narrative  ? Not on file  ? ?Social Determinants of Health  ? ?Financial Resource Strain: Not on file  ?Food Insecurity: Not on file  ?Transportation Needs: Not on file  ?Physical Activity: Not on file  ?Stress: Not on file  ?Social Connections: Not on file  ?Intimate Partner Violence: Not on file  ? ? ?FAMILY HISTORY: ?Family History  ?Problem Relation Age of Onset  ? Hypertension Mother   ? Pancreatic cancer Father 62  ? Hypertension Sister   ? Heart disease Sister   ? Hypertension Sister   ? Heart disease Sister   ? Hypertension Sister   ? ? ?ALLERGIES:  is allergic to other. ? ?MEDICATIONS:  ?Current Outpatient Medications  ?Medication Sig Dispense Refill  ? acetaminophen (TYLENOL) 500 MG tablet Take 500 mg by mouth every 6 (six) hours as needed for pain.    ?  amLODipine (NORVASC) 10 MG tablet Take 10 mg by mouth daily.    ? cyclobenzaprine (FLEXERIL) 10 MG tablet Take 1 tablet (10 mg total) by mouth 2 (two) times daily as needed for muscle spasms. 20 tablet 0  ? hydrochlorothiazide (HYDRODIURIL) 25 MG tablet Take 25 mg by mouth daily.  0  ? ibuprofen (ADVIL) 200 MG tablet Take 200 mg by mouth every 6 (six) hours as needed.    ? metoprolol tartrate (LOPRESSOR) 25 MG tablet Take 25 mg by mouth 2 (two) times daily.    ? vitamin B-12 (CYANOCOBALAMIN) 1000 MCG tablet Take 1 tablet (1,000 mcg total) by mouth daily. 90 tablet 3  ? ?No current facility-administered medications for this visit.  ? ? ?REVIEW OF SYSTEMS:   ?Constitutional: ( - ) fevers, (  - )  chills , ( - ) night sweats ?Eyes: ( - ) blurriness of vision, ( - ) double vision, ( - ) watery eyes ?Ears, nose, mouth, throat, and face: ( - ) mucositis, ( - ) sore throat ?Respiratory: ( - ) cough, ( - ) dyspnea, ( - ) wheezes ?Cardiovascular: ( - ) palpitation, ( - ) chest discomfort, ( - ) lower extremity swelling ?Gastrointestinal:  ( - ) nausea, ( - ) heartburn, ( - ) change in bowel habits ?Skin: ( - ) abnormal skin rashes ?Lymphatics: ( - ) new lymphadenopathy, ( - ) easy bruising ?Neurological: ( - ) numbness, ( - ) tingling, ( - ) new weaknesses ?Behavioral/Psych: ( - ) mood change, ( - ) new changes  ?All other systems were reviewed with the patient and are negative. ? ?PHYSICAL EXAMINATION: ? ?Vitals:  ? 12/03/21 1459  ?BP: (!) 147/91  ?Pulse: 72  ?Resp: 16  ?Temp: (!) 97.5 ?F (36.4 ?C)  ?SpO2: 97%  ? ? ? ?Filed Weights  ? 12/03/21 1459  ?Weight: 194 lb 11.2 oz (88.3 kg)  ? ? ?GENERAL: well appearing middle aged [place] American female alert, no distress and comfortable ?SKIN: skin color, texture, turgor are normal, no rashes or significant lesions ?EYES: conjunctiva are pink and non-injected, sclera clear ?LUNGS: clear to auscultation and percussion with normal breathing effort ?HEART: regular rate & rhythm and no murmurs and no lower extremity edema ?Musculoskeletal: no cyanosis of digits and no clubbing  ?PSYCH: alert & oriented x 3, fluent speech ?NEURO: no focal motor/sensory deficits ? ?LABORATORY DATA:  ?I have reviewed the data as listed ? ?  Latest Ref Rng & Units 12/03/2021  ?  2:42 PM 05/30/2021  ?  8:16 AM 10/20/2020  ?  8:28 AM  ?CBC  ?WBC 4.0 - 10.5 K/uL 3.5   3.8   3.4    ?Hemoglobin 12.0 - 15.0 g/dL 12.9   13.0   13.1    ?Hematocrit 36.0 - 46.0 % 37.9   38.9   37.9    ?Platelets 150 - 400 K/uL 277   251   264    ? ? ? ?  Latest Ref Rng & Units 12/03/2021  ?  2:42 PM 05/30/2021  ?  8:16 AM 10/20/2020  ?  8:28 AM  ?CMP  ?Glucose 70 - 99 mg/dL 95   92   107    ?BUN 6 - 20 mg/dL 14   10   8      ?Creatinine 0.44 - 1.00 mg/dL 0.95   0.75   0.78    ?Sodium 135 - 145 mmol/L 138   142   141    ?  Potassium 3.5 - 5.1 mmol/L 3.8   3.4   3.2    ?Chloride 98 - 111 mmol/L 101   106   104    ?CO2 22 - 32 mmol/L 33   29   27    ?Calcium 8.9 - 10.3 mg/dL 9.9   9.9   9.3    ?Total Protein 6.5 - 8.1 g/dL 7.2   7.2   6.9    ?Total Bilirubin 0.3 - 1.2 mg/dL 0.3   0.5   0.5    ?Alkaline Phos 38 - 126 U/L 65   53   56    ?AST 15 - 41 U/L 12   19   13     ?ALT 0 - 44 U/L 14   19   13     ? ? ? ?RADIOGRAPHIC STUDIES: ?I have personally reviewed the radiological images as listed and agreed with the findings in the report. ?No results found. ? ?ASSESSMENT & PLAN ?Karle Plumber 46 y.o. female with medical history significant for leukopenia who presents for a follow up visit.  ? ?After review the labs, the records, discussed with the patient the findings most consistent with a mild neutropenia/leukopenia.  The patient was also found to have vitamin B12 deficiency and is unclear if these 2 conditions are related.  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  As such we will have the patient follow-up on an as-needed basis. ? ?# Leukopenia/Neutropenia, stable ?# Vitamin B12 deficiency  ?--today will order CBC, CMP, vitamin b12, folate ?--viral workup with Hep B and Hep C. HIV tested negative in 2019 ?--recent thyroid studies with PCP unremarkable ?--if WBC is worsening or no clear explanation can be found for persistent leukopenia we will need to consider bone marrow biopsy ?--continue PO vitamin b12 1049mg until her current supply runs out.  Then she can discontinue the medication ?--today WBC 3.5, ANC 1.0.  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  Would recommend re-referral to our clinic in the event that her APortagelevels were to be consistently below 1.0 ?--RTC PRN ? ?No orders of the defined types were placed in  this encounter. ? ?All questions were answered. The patient knows to call the clinic with any problems, questions or concerns. ? ?A total of more than 30 minutes were spent on this encounter and over

## 2021-12-06 LAB — METHYLMALONIC ACID, SERUM: Methylmalonic Acid, Quantitative: 104 nmol/L (ref 0–378)

## 2022-10-27 IMAGING — DX DG THORACIC SPINE 2V
2 series · 2 of 2 positions shown · non-contrast
Comparison: None.

CLINICAL DATA: Back pain after lifting boxes.

EXAM:
THORACIC SPINE 2 VIEWS

[t-spine ap]
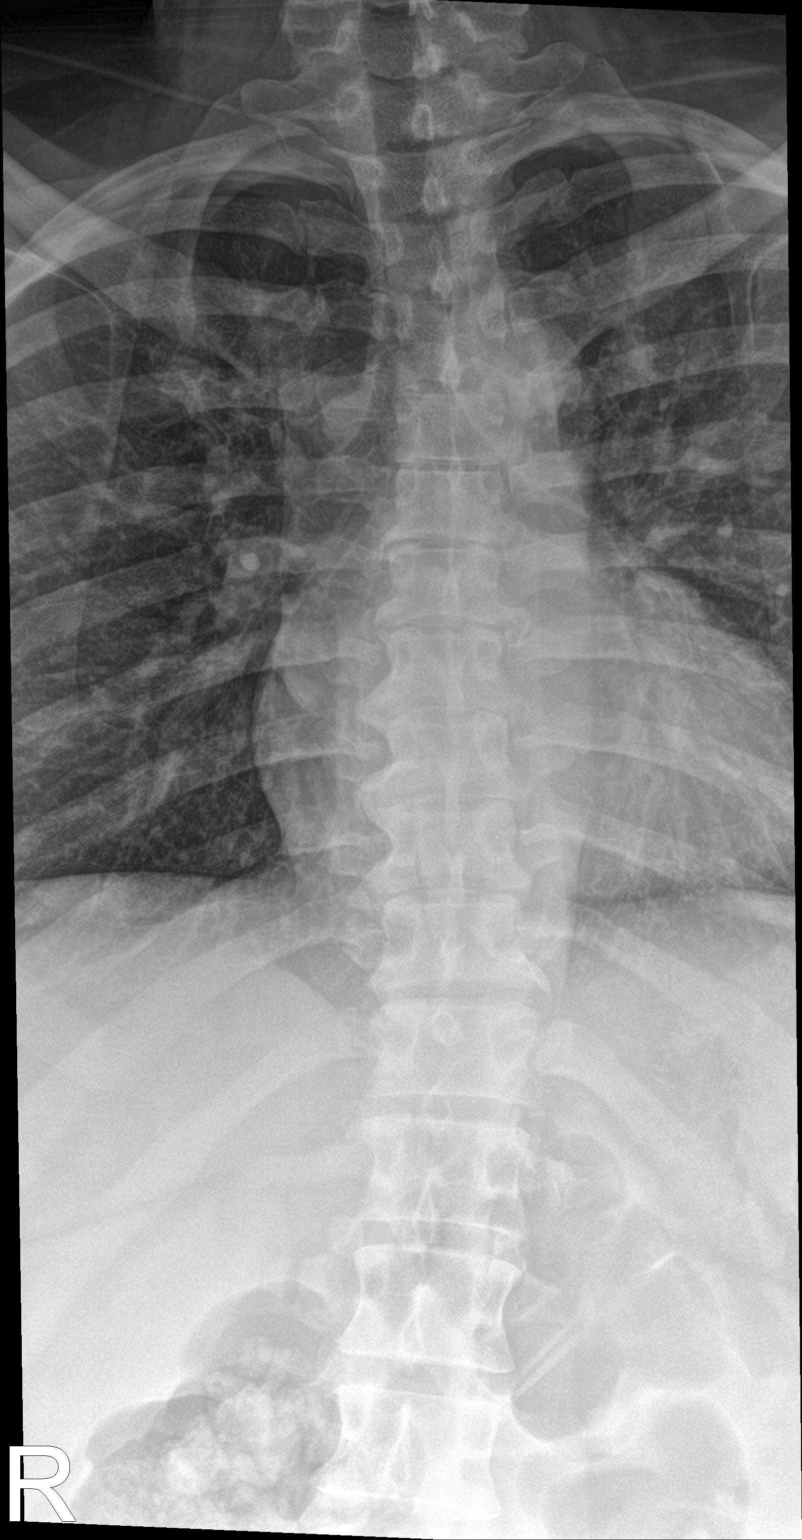

[t-spine lat]
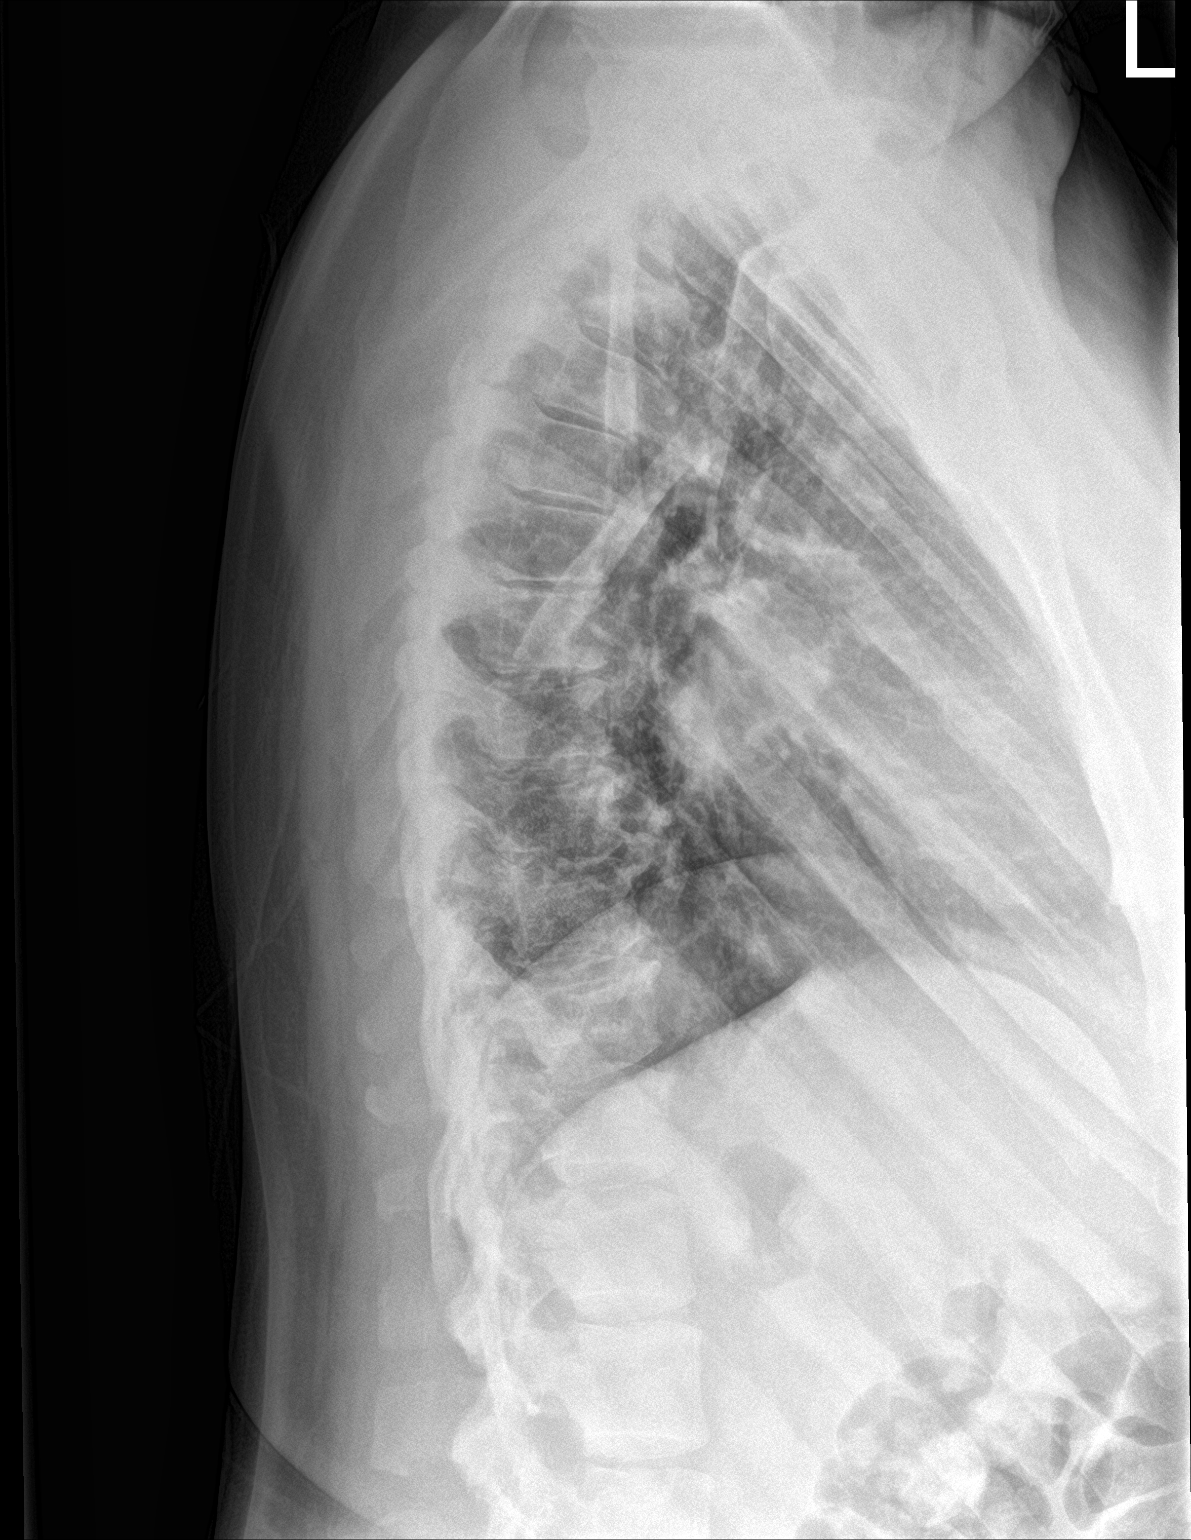

[2 of 2 positions shown; findings below may reference images not displayed]

FINDINGS: No evidence for thoracic spine fracture. No worrisome lytic or
sclerotic osseous abnormality. Intervertebral disc spaces are
preserved. No abnormal paraspinal line on the frontal projection.
Mild convex leftward thoracic scoliosis evident.
IMPRESSION: Negative.

## 2023-08-03 ENCOUNTER — Emergency Department (HOSPITAL_COMMUNITY)
Admission: EM | Admit: 2023-08-03 | Discharge: 2023-08-04 | Disposition: A | Discharge status: Home / Self Care | Payer: 59 | Attending: Emergency Medicine | Admitting: Emergency Medicine

## 2023-08-03 ENCOUNTER — Other Ambulatory Visit: Payer: Self-pay

## 2023-08-03 ENCOUNTER — Encounter (HOSPITAL_COMMUNITY): Payer: Self-pay | Admitting: *Deleted

## 2023-08-03 ENCOUNTER — Emergency Department (HOSPITAL_COMMUNITY): Payer: 59

## 2023-08-03 DIAGNOSIS — S8392XA Sprain of unspecified site of left knee, initial encounter: Secondary | ICD-10-CM | POA: Diagnosis not present

## 2023-08-03 DIAGNOSIS — S8992XA Unspecified injury of left lower leg, initial encounter: Secondary | ICD-10-CM | POA: Diagnosis present

## 2023-08-03 DIAGNOSIS — W19XXXA Unspecified fall, initial encounter: Secondary | ICD-10-CM | POA: Diagnosis not present

## 2023-08-03 NOTE — ED Triage Notes (Signed)
The pt fell today and she has pain in her lt knee since the fall and she is anxious

## 2023-08-04 NOTE — Progress Notes (Signed)
Orthopedic Tech Progress Note Patient Details:  Sandra Robbins May 06, 1976 [id number]  Ortho Devices Type of Ortho Device: Crutches Ortho Device/Splint Interventions: Ordered, Application, Adjustment   Post Interventions Patient Tolerated: Well Instructions Provided: Care of device, Adjustment of device  Trinna Post 08/04/2023, 3:58 AM

## 2023-08-04 NOTE — ED Provider Notes (Signed)
[place] EMERGENCY DEPARTMENT AT [facility] Provider Note   CSN: [id number] Arrival date & time: 08/03/23  2200     History  Chief Complaint  Patient presents with   Leg Injury    Sandra Robbins is a 47 y.o. female.  The history is provided by the patient.  Patient presents after injuring her left leg.  Patient reports she made a sudden turn and she felt immediate pain in her left knee and she fell to the floor.  No traumatic injuries, but she has had pain in the left knee since that time.  She reports it hurts to walk.     Home Medications Prior to Admission medications   Medication Sig Start Date End Date Taking? Authorizing Provider  acetaminophen (TYLENOL) 500 MG tablet Take 500 mg by mouth every 6 (six) hours as needed for pain.    [provider]  amLODipine (NORVASC) 10 MG tablet Take 10 mg by mouth daily.    [provider]  cyclobenzaprine (FLEXERIL) 10 MG tablet Take 1 tablet (10 mg total) by mouth 2 (two) times daily as needed for muscle spasms. 12/05/20   Rushie Chestnut, PA-C  hydrochlorothiazide (HYDRODIURIL) 25 MG tablet Take 25 mg by mouth daily. 05/12/18   [provider]  ibuprofen (ADVIL) 200 MG tablet Take 200 mg by mouth every 6 (six) hours as needed.    [provider]  metoprolol tartrate (LOPRESSOR) 25 MG tablet Take 25 mg by mouth 2 (two) times daily.    [provider]  vitamin B-12 (CYANOCOBALAMIN) 1000 MCG tablet Take 1 tablet (1,000 mcg total) by mouth daily. 06/05/21   Jaci Standard, MD      Allergies    Other    Review of Systems   Review of Systems  Physical Exam Updated Vital Signs BP (!) 186/94 (BP Location: Left Arm)   Pulse (!) 136   Temp 98.3 F (36.8 C)   Resp 15   Ht 1.702 m (5\' 7" )   Wt 88.3 kg   LMP 07/04/2023   SpO2 100%   BMI 30.49 kg/m  Physical Exam CONSTITUTIONAL: Well developed/well nourished HEAD: Normocephalic/atraumatic NEURO: Pt is  awake/alert/appropriate, moves all extremitiesx4.  No facial droop.   EXTREMITIES: pulses normal/equal, full ROM Tenderness to palpation of left knee, tenderness to range of motion of left knee.  No deformities.  Distal pulses equal and intact.  Full range of motion of left hip without difficulty.  There is no tenderness noted to left foot or ankle. SKIN: warm, color normal PSYCH: mildly anxious  ED Results / Procedures / Treatments   Labs (all labs ordered are listed, but only abnormal results are displayed) Labs Reviewed - No data to display  EKG None  Radiology DG Knee Left Port Result Date: 08/03/2023 CLINICAL DATA:  Felt a pop and lateral side of knee. EXAM: PORTABLE LEFT KNEE - 1-2 VIEW COMPARISON:  None Available. FINDINGS: There is no acute fracture or dislocation. There is a small suprapatellar joint effusion. Joint spaces are well maintained. There is lateral soft tissue swelling of the knee. IMPRESSION: 1. No acute fracture or dislocation. 2. Small suprapatellar joint effusion. 3. Lateral soft tissue swelling of the knee. Electronically Signed   By: Darliss Cheney M.D.   On: 08/03/2023 23:17    Procedures Procedures    Medications Ordered in ED Medications - No data to display  ED Course/ Medical Decision Making/ A&P  Medical Decision Making  Pt reports after injuring her left knee She pain only in the left knee No other signs of acute traumatic injury Will provide crutches, recommend rest ice and elevation, refer to orthopedics        Final Clinical Impression(s) / ED Diagnoses Final diagnoses:  Sprain of left knee, unspecified ligament, initial encounter    Rx / DC Orders ED Discharge Orders     None         Zadie Rhine, MD 08/04/23 [phone]

## 2024-03-16 ENCOUNTER — Emergency Department (HOSPITAL_COMMUNITY): Admission: EM | Admit: 2024-03-16 | Discharge: 2024-03-16 | Discharge status: Against Medical Advice
